# Patient Record
Sex: Female | Born: 1965 | Race: White | Hispanic: No | Marital: Single | Smoking: Never smoker
Health system: Southern US, Community
[De-identification: ages and names within clinical notes are randomized; demographics above are authoritative.]

## PROBLEM LIST (undated history)

## (undated) ENCOUNTER — Ambulatory Visit: Admission: EM | Discharge status: Absent without leave | Payer: 59

## (undated) DIAGNOSIS — R51 Headache: Secondary | ICD-10-CM

## (undated) DIAGNOSIS — C50919 Malignant neoplasm of unspecified site of unspecified female breast: Secondary | ICD-10-CM

## (undated) DIAGNOSIS — R519 Headache, unspecified: Secondary | ICD-10-CM

## (undated) DIAGNOSIS — F419 Anxiety disorder, unspecified: Secondary | ICD-10-CM

## (undated) DIAGNOSIS — Z923 Personal history of irradiation: Secondary | ICD-10-CM

## (undated) DIAGNOSIS — I10 Essential (primary) hypertension: Secondary | ICD-10-CM

## (undated) HISTORY — PX: TUBAL LIGATION: SHX77

## (undated) HISTORY — DX: Headache: R51

## (undated) HISTORY — DX: Malignant neoplasm of unspecified site of unspecified female breast: C50.919

## (undated) HISTORY — DX: Essential (primary) hypertension: I10

## (undated) HISTORY — PX: DIAGNOSTIC LAPAROSCOPY: SUR761

## (undated) HISTORY — DX: Headache, unspecified: R51.9

---

## 2008-05-16 ENCOUNTER — Inpatient Hospital Stay: Payer: Self-pay | Admitting: Obstetrics and Gynecology

## 2008-05-25 ENCOUNTER — Ambulatory Visit: Payer: Self-pay | Admitting: Obstetrics and Gynecology

## 2008-05-30 ENCOUNTER — Ambulatory Visit: Payer: Self-pay | Admitting: Obstetrics and Gynecology

## 2008-05-31 ENCOUNTER — Ambulatory Visit: Payer: Self-pay | Admitting: Oncology

## 2008-06-02 ENCOUNTER — Ambulatory Visit: Payer: Self-pay | Admitting: Oncology

## 2008-06-07 ENCOUNTER — Ambulatory Visit: Payer: Self-pay | Admitting: Obstetrics and Gynecology

## 2008-06-14 ENCOUNTER — Ambulatory Visit: Payer: Self-pay | Admitting: Obstetrics and Gynecology

## 2011-12-09 ENCOUNTER — Ambulatory Visit: Payer: Self-pay | Admitting: Internal Medicine

## 2012-02-22 ENCOUNTER — Observation Stay: Payer: Self-pay | Admitting: Internal Medicine

## 2012-02-22 ENCOUNTER — Ambulatory Visit: Payer: Self-pay | Admitting: Internal Medicine

## 2012-02-22 LAB — DRUG SCREEN, URINE
Barbiturates, Ur Screen: NEGATIVE (ref ?–200)
Benzodiazepine, Ur Scrn: NEGATIVE (ref ?–200)
Cannabinoid 50 Ng, Ur ~~LOC~~: NEGATIVE (ref ?–50)
Cocaine Metabolite,Ur ~~LOC~~: NEGATIVE (ref ?–300)
MDMA (Ecstasy)Ur Screen: NEGATIVE (ref ?–500)
Methadone, Ur Screen: NEGATIVE (ref ?–300)
Phencyclidine (PCP) Ur S: NEGATIVE (ref ?–25)

## 2012-02-22 LAB — COMPREHENSIVE METABOLIC PANEL
Albumin: 4.4 g/dL (ref 3.4–5.0)
Anion Gap: 7 (ref 7–16)
Calcium, Total: 9.2 mg/dL (ref 8.5–10.1)
Chloride: 102 mmol/L (ref 98–107)
EGFR (African American): >60
EGFR (Non-African Amer.): >60
Glucose: 91 mg/dL (ref 65–99)
Osmolality: 279 (ref 275–301)
Potassium: 3.7 mmol/L (ref 3.5–5.1)
Sodium: 140 mmol/L (ref 136–145)
Total Protein: 7.4 g/dL (ref 6.4–8.2)

## 2012-02-22 LAB — URINALYSIS, COMPLETE
Bilirubin,UR: NEGATIVE
Glucose,UR: NEGATIVE mg/dL (ref 0–75)
Ketone: NEGATIVE
Ph: 7 (ref 4.5–8.0)
Protein: NEGATIVE
Specific Gravity: 1.01 (ref 1.003–1.030)

## 2012-02-22 LAB — CK TOTAL AND CKMB (NOT AT ARMC): CK, Total: 44 U/L (ref 21–215)

## 2012-02-22 LAB — CBC WITH DIFFERENTIAL/PLATELET
Basophil #: 0.1 10*3/uL (ref 0.0–0.1)
Eosinophil #: 0.7 10*3/uL (ref 0.0–0.7)
Lymphocyte #: 2 10*3/uL (ref 1.0–3.6)
MCH: 31.2 pg (ref 26.0–34.0)
MCHC: 34.2 g/dL (ref 32.0–36.0)
MCV: 91 fL (ref 80–100)
Monocyte #: 0.4 x10 3/mm (ref 0.2–0.9)
Monocyte %: 6.2 %
Platelet: 260 10*3/uL (ref 150–440)
RDW: 12.1 % (ref 11.5–14.5)

## 2012-02-22 LAB — PREGNANCY, URINE: Pregnancy Test, Urine: NEGATIVE m[IU]/mL

## 2012-02-22 LAB — TROPONIN I: Troponin-I: <0.02 ng/mL

## 2012-02-23 LAB — TROPONIN I
Troponin-I: <0.02 ng/mL
Troponin-I: <0.02 ng/mL

## 2012-02-23 LAB — BASIC METABOLIC PANEL
BUN: 16 mg/dL (ref 7–18)
Calcium, Total: 8.5 mg/dL (ref 8.5–10.1)
Chloride: 106 mmol/L (ref 98–107)
EGFR (African American): >60
EGFR (Non-African Amer.): >60
Glucose: 110 mg/dL — ABNORMAL HIGH (ref 65–99)
Osmolality: 283 (ref 275–301)
Potassium: 3.9 mmol/L (ref 3.5–5.1)
Sodium: 141 mmol/L (ref 136–145)

## 2012-02-23 LAB — LIPID PANEL
Ldl Cholesterol, Calc: 84 mg/dL (ref 0–100)
Triglycerides: 296 mg/dL — ABNORMAL HIGH (ref 0–200)
VLDL Cholesterol, Calc: 59 mg/dL — ABNORMAL HIGH (ref 5–40)

## 2012-02-23 LAB — CK TOTAL AND CKMB (NOT AT ARMC)
CK, Total: 33 U/L (ref 21–215)
CK-MB: <0.5 ng/mL — ABNORMAL LOW (ref 0.5–3.6)

## 2012-05-03 ENCOUNTER — Ambulatory Visit: Payer: Self-pay | Admitting: Internal Medicine

## 2012-10-02 ENCOUNTER — Emergency Department: Payer: Self-pay | Admitting: Emergency Medicine

## 2012-10-02 LAB — URINALYSIS, COMPLETE
Bacteria: NONE SEEN
Bilirubin,UR: NEGATIVE
Glucose,UR: NEGATIVE mg/dL (ref 0–75)
Ketone: NEGATIVE
Nitrite: NEGATIVE
Ph: 5 (ref 4.5–8.0)
RBC,UR: 4 /HPF (ref 0–5)
Squamous Epithelial: 7
WBC UR: 9 /HPF (ref 0–5)

## 2012-10-02 LAB — COMPREHENSIVE METABOLIC PANEL
Alkaline Phosphatase: 104 U/L (ref 50–136)
Anion Gap: 8 (ref 7–16)
Bilirubin,Total: 0.3 mg/dL (ref 0.2–1.0)
Calcium, Total: 8.9 mg/dL (ref 8.5–10.1)
Chloride: 107 mmol/L (ref 98–107)
Creatinine: 0.7 mg/dL (ref 0.60–1.30)
EGFR (African American): >60
Glucose: 113 mg/dL — ABNORMAL HIGH (ref 65–99)
Osmolality: 280 (ref 275–301)
Potassium: 4 mmol/L (ref 3.5–5.1)
Sodium: 139 mmol/L (ref 136–145)
Total Protein: 7.9 g/dL (ref 6.4–8.2)

## 2012-10-02 LAB — CBC
HGB: 15.3 g/dL (ref 12.0–16.0)
MCH: 30.4 pg (ref 26.0–34.0)
MCHC: 33.8 g/dL (ref 32.0–36.0)
WBC: 18.7 10*3/uL — ABNORMAL HIGH (ref 3.6–11.0)

## 2012-10-02 LAB — LIPASE, BLOOD: Lipase: 91 U/L (ref 73–393)

## 2012-10-03 ENCOUNTER — Ambulatory Visit: Payer: Self-pay | Admitting: Family Medicine

## 2012-10-03 LAB — URINALYSIS, COMPLETE
Ketone: NEGATIVE
Leukocyte Esterase: NEGATIVE
Nitrite: NEGATIVE
Ph: 6.5 (ref 4.5–8.0)
WBC UR: NONE SEEN /HPF (ref 0–5)

## 2012-10-03 LAB — PREGNANCY, URINE: Pregnancy Test, Urine: NEGATIVE m[IU]/mL

## 2013-11-01 ENCOUNTER — Ambulatory Visit: Payer: Self-pay | Admitting: Internal Medicine

## 2013-11-12 ENCOUNTER — Ambulatory Visit: Payer: Self-pay | Admitting: Internal Medicine

## 2014-06-02 HISTORY — PX: BREAST BIOPSY: SHX20

## 2014-06-24 ENCOUNTER — Ambulatory Visit: Payer: Self-pay | Admitting: Internal Medicine

## 2014-06-25 ENCOUNTER — Ambulatory Visit: Payer: Self-pay | Admitting: Internal Medicine

## 2014-06-26 ENCOUNTER — Ambulatory Visit: Payer: Self-pay | Admitting: Internal Medicine

## 2014-07-02 HISTORY — PX: BREAST BIOPSY: SHX20

## 2014-07-03 ENCOUNTER — Other Ambulatory Visit: Payer: Self-pay | Admitting: Internal Medicine

## 2014-07-03 DIAGNOSIS — N6489 Other specified disorders of breast: Secondary | ICD-10-CM

## 2014-07-16 ENCOUNTER — Ambulatory Visit
Admission: RE | Admit: 2014-07-16 | Discharge: 2014-07-16 | Disposition: A | Discharge status: Home / Self Care | Payer: BC Managed Care – PPO | Source: Ambulatory Visit | Attending: Internal Medicine | Admitting: Internal Medicine

## 2014-07-16 DIAGNOSIS — N6489 Other specified disorders of breast: Secondary | ICD-10-CM

## 2014-07-16 MED ORDER — GADOBENATE DIMEGLUMINE 529 MG/ML IV SOLN
12.0000 mL | Freq: Once | INTRAVENOUS | Status: AC | PRN
  Administered 2014-07-16: 12 mL via INTRAVENOUS

## 2014-07-17 ENCOUNTER — Other Ambulatory Visit: Payer: Self-pay | Admitting: Internal Medicine

## 2014-07-17 DIAGNOSIS — R928 Other abnormal and inconclusive findings on diagnostic imaging of breast: Secondary | ICD-10-CM

## 2014-08-01 ENCOUNTER — Other Ambulatory Visit: Payer: Self-pay | Admitting: Internal Medicine

## 2014-08-01 ENCOUNTER — Ambulatory Visit
Admission: RE | Admit: 2014-08-01 | Discharge: 2014-08-01 | Disposition: A | Discharge status: Home / Self Care | Payer: BC Managed Care – PPO | Source: Ambulatory Visit | Attending: Internal Medicine | Admitting: Internal Medicine

## 2014-08-01 DIAGNOSIS — R928 Other abnormal and inconclusive findings on diagnostic imaging of breast: Secondary | ICD-10-CM

## 2014-08-01 MED ORDER — GADOBENATE DIMEGLUMINE 529 MG/ML IV SOLN
12.0000 mL | Freq: Once | INTRAVENOUS | Status: AC | PRN
  Administered 2014-08-01: 12 mL via INTRAVENOUS

## 2014-08-07 ENCOUNTER — Telehealth: Payer: Self-pay | Admitting: *Deleted

## 2014-08-07 DIAGNOSIS — C50412 Malignant neoplasm of upper-outer quadrant of left female breast: Secondary | ICD-10-CM

## 2014-08-07 NOTE — Telephone Encounter (Signed)
Confirmed [facility] for 08/14/14 at 8am .  Instructions and contact information given.

## 2014-08-14 ENCOUNTER — Encounter (INDEPENDENT_AMBULATORY_CARE_PROVIDER_SITE_OTHER): Payer: Self-pay

## 2014-08-14 ENCOUNTER — Ambulatory Visit (INDEPENDENT_AMBULATORY_CARE_PROVIDER_SITE_OTHER): Payer: Self-pay | Admitting: General Surgery

## 2014-08-14 ENCOUNTER — Ambulatory Visit: Payer: BLUE CROSS/BLUE SHIELD

## 2014-08-14 ENCOUNTER — Ambulatory Visit (HOSPITAL_BASED_OUTPATIENT_CLINIC_OR_DEPARTMENT_OTHER): Payer: BLUE CROSS/BLUE SHIELD | Admitting: Hematology and Oncology

## 2014-08-14 ENCOUNTER — Other Ambulatory Visit (INDEPENDENT_AMBULATORY_CARE_PROVIDER_SITE_OTHER): Payer: Self-pay | Admitting: General Surgery

## 2014-08-14 ENCOUNTER — Other Ambulatory Visit (HOSPITAL_BASED_OUTPATIENT_CLINIC_OR_DEPARTMENT_OTHER): Payer: BLUE CROSS/BLUE SHIELD

## 2014-08-14 ENCOUNTER — Ambulatory Visit
Admission: RE | Admit: 2014-08-14 | Discharge: 2014-08-14 | Disposition: A | Discharge status: Home / Self Care | Payer: BLUE CROSS/BLUE SHIELD | Source: Ambulatory Visit | Attending: Radiation Oncology | Admitting: Radiation Oncology

## 2014-08-14 ENCOUNTER — Encounter: Payer: Self-pay | Admitting: Hematology and Oncology

## 2014-08-14 ENCOUNTER — Encounter: Payer: Self-pay | Admitting: *Deleted

## 2014-08-14 VITALS — BP 132/77 | HR 61 | Temp 98.1°F | Resp 18 | Ht 64.0 in | Wt 138.7 lb

## 2014-08-14 DIAGNOSIS — C50412 Malignant neoplasm of upper-outer quadrant of left female breast: Secondary | ICD-10-CM

## 2014-08-14 DIAGNOSIS — C50912 Malignant neoplasm of unspecified site of left female breast: Secondary | ICD-10-CM

## 2014-08-14 DIAGNOSIS — Z171 Estrogen receptor negative status [ER-]: Secondary | ICD-10-CM

## 2014-08-14 LAB — CBC WITH DIFFERENTIAL/PLATELET
BASO%: 1.2 % (ref 0.0–2.0)
Basophils Absolute: 0.1 10*3/uL (ref 0.0–0.1)
EOS%: 8.7 % — ABNORMAL HIGH (ref 0.0–7.0)
Eosinophils Absolute: 0.7 10*3/uL — ABNORMAL HIGH (ref 0.0–0.5)
HCT: 41.8 % (ref 34.8–46.6)
HGB: 13.8 g/dL (ref 11.6–15.9)
LYMPH%: 27.3 % (ref 14.0–49.7)
MCH: 30.6 pg (ref 25.1–34.0)
MCHC: 32.9 g/dL (ref 31.5–36.0)
MCV: 93 fL (ref 79.5–101.0)
MONO#: 0.6 10*3/uL (ref 0.1–0.9)
MONO%: 8.1 % (ref 0.0–14.0)
NEUT#: 4.3 10*3/uL (ref 1.5–6.5)
NEUT%: 54.7 % (ref 38.4–76.8)
Platelets: 305 10*3/uL (ref 145–400)
RBC: 4.5 10*6/uL (ref 3.70–5.45)
RDW: 12.6 % (ref 11.2–14.5)
WBC: 7.8 10*3/uL (ref 3.9–10.3)
lymph#: 2.1 10*3/uL (ref 0.9–3.3)

## 2014-08-14 LAB — COMPREHENSIVE METABOLIC PANEL (CC13)
ALT: 10 U/L (ref 0–55)
AST: 14 U/L (ref 5–34)
Albumin: 4.1 g/dL (ref 3.5–5.0)
Alkaline Phosphatase: 95 U/L (ref 40–150)
Anion Gap: 8 meq/L (ref 3–11)
BUN: 12.9 mg/dL (ref 7.0–26.0)
CO2: 28 meq/L (ref 22–29)
Calcium: 8.7 mg/dL (ref 8.4–10.4)
Chloride: 106 meq/L (ref 98–109)
Creatinine: 0.7 mg/dL (ref 0.6–1.1)
EGFR: >90 mL/min/{1.73_m2}
Glucose: 89 mg/dL (ref 70–140)
Potassium: 3.8 meq/L (ref 3.5–5.1)
Sodium: 142 meq/L (ref 136–145)
Total Bilirubin: 0.46 mg/dL (ref 0.20–1.20)
Total Protein: 6.8 g/dL (ref 6.4–8.3)

## 2014-08-14 NOTE — Assessment & Plan Note (Signed)
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 I discussed that stage 0 breast cancer and described the size of his found on MRI. Also discussed the significance of being estrogen and progesterone receptor negative.  Recommendation: Based on multidisciplinary tumor board Breast conserving surgery followed by radiation Genetics counseling  Antiestrogen therapy counseling: I discussed with her briefly that antiestrogen therapy would not be of great benefit because she has ER/PR negative disease. However there are small patients who may benefit from antiestrogen therapy to prevent ER positive DCIS of breast cancer. Patient was not very keen on undergoing this therapy given the risks of antiestrogen treatment and very minimal benefit.  Return to clinic after surgery to discuss adjuvant treatment options.

## 2014-08-14 NOTE — Progress Notes (Signed)
[place] Psychosocial Distress Screening Clinical Social Work [facility]  Patient completed distress screening protocol and scored a 4 on the Psychosocial Distress Thermometer which indicates mild distress. Clinical Social Worker met with patient and patients daughter in [facility] to assess for distress and other psychosocial needs.  Patient stated she was "doign ok" after meeting with the treatment team and getting information on her treatment plan.  CSW and patient discussed common emotional responses to being diagnosed with cancer.  CSw informed patient of the support team and support services at [facility].  CSW provided contact information and encouraged her to call with any questions or concerns.      ONCBCN DISTRESS SCREENING 08/14/2014  Screening Type Initial Screening  Distress experienced in past week (1-10) 4  Practical problem type Insurance;Work/school  Emotional problem type Nervousness/Anxiety;Adjusting to illness;Adjusting to appearance changes  Spiritual/Religous concerns type Relating to God  Physician notified of physical symptoms Yes  Referral to clinical social work Yes  Referral to support programs Yes   Johnnye Lana, MSW, LCSW, OSW-C Clinical Social Worker [place] [phone]

## 2014-08-14 NOTE — Progress Notes (Signed)
Ms. Hillegass is a very pleasant 49 y.o. 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  Biopsy results revealed the tumor's hormone status as ER negative and PR negative.   She presents today with her daughter to the [facility]) for treatment consideration and recommendations from the breast surgeon, radiation oncologist, and medical oncologist.     I briefly met with Ms. Garno and her daughter during her [facility] visit today. We discussed the purpose of the Survivorship Clinic, which will include monitoring for recurrence, coordinating completion of age and gender-appropriate cancer screenings, promotion of overall wellness, as well as managing potential late/long-term side effects of anti-cancer treatments.    As of today, the treatment plan for Ms. Bayles will likely include surgery and radiation therapy.  She will meet with the Genetics Counselor due to her current age at diagnosis.  The intent of treatment for Ms. Portee is cure, therefore she will be eligible for the Survivorship Clinic upon her completion of treatment.  Her survivorship care plan (SCP) document will be drafted and updated throughout the course of her treatment trajectory. She will receive the SCP in an office visit with myself in the Survivorship Clinic once she has completed treatment.   Ms. Lal was encouraged to ask questions and all questions were answered to her satisfaction.  She was given my business card and encouraged to contact me with any concerns regarding survivorship.  I look forward to participating in her care.   Mike Craze, NP [place] [phone]

## 2014-08-14 NOTE — Progress Notes (Signed)
MD note created during office visit given to patient.  Original sent to scan.

## 2014-08-14 NOTE — Progress Notes (Signed)
Checked in new pt with no financial concerns prior to seeing the dr.  I informed pt if chemo is part of her treatment Raquel will call her ins to see if Josem Kaufmann is req and will obtain that if it is and will contact different foundations that offer copay assistance for chemo if needed.  She has Raquel's card for any billing or insurance questions or concerns.

## 2014-08-14 NOTE — Progress Notes (Signed)
[place] CONSULT NOTE  Patient Care Team: Harriett Neita Carp, MD as PCP - General (Internal Medicine) Excell Seltzer, MD as Consulting Physician (General Surgery) Rulon Eisenmenger, MD as Consulting Physician (Hematology and Oncology) Jodelle Gross, MD as Consulting Physician (Radiation Oncology) Trinda Pascal, NP as Nurse Practitioner (Nurse Practitioner) Roselee Culver, RN as Registered Nurse [facility], RN as Registered Nurse  CHIEF COMPLAINTS/PURPOSE OF CONSULTATION:  Newly diagnosed left breast DCIS  HISTORY OF PRESENTING ILLNESS:  Brittney Horton 49 y.o. female is here because of recent diagnosis of left breast DCIS She had a original mammogram 6 months prior which showed an abnormality this was followed up by another mammogram in 6 months which revealed left breast distortion in the left lower outer quadrant. On ultrasound there was no abnormality. She underwent breast MRI in 07/16/2014 that revealed a 1.9 x 1.6 x 1.2 cm area of enhancement. This was biopsied on 08/01/2014 it came back as DCIS grade 3 ER/PR negative. She was present this morning at the multidisciplinary tumor board and she is here today at the [facility] clinic to discuss treatment options. She denies any pain discomfort or any issues related to her diagnosis. She is accompanied by her daughter today to discuss treatment options  I reviewed her records extensively and collaborated the history with the patient.  SUMMARY OF ONCOLOGIC HISTORY:   Breast cancer of upper-outer quadrant of left female breast   07/16/2014 Breast MRI Left breast: 1.9 x 1.6 x 1.2 cm irregular area of low-grade non-mass enhancement, multiple small bilateral cysts   08/01/2014 Initial Diagnosis Left breast lower outer quadrant biopsy: DCIS with necrosis and calcifications grade 3 ER 0% PR 0%    In terms of breast cancer risk profile:  She menarched at early age of 80 and still has periods  She had 1 pregnancy,  her first child was born at age 41  She hasnot received birth control pills She was never exposed to fertility medications or hormone replacement therapy.  She has no family history of Breast/GYN/GI cancer  MEDICAL HISTORY:  Past Medical History  Diagnosis Date  . Breast cancer   . Hypertension   . Headache     SURGICAL HISTORY: History reviewed. No pertinent past surgical history.  SOCIAL HISTORY:  History   Social History  . Marital Status: Single    Spouse Name: N/A    Number of Children: N/A  . Years of Education: N/A   Occupational History  . Not on file.   Social History Main Topics  . Smoking status: Never Smoker   . Smokeless tobacco: Not on file  . Alcohol Use: Yes  . Drug Use: No  . Sexual Activity: Not on file   Other Topics Concern  . Not on file   Social History Narrative  . No narrative on file    FAMILY HISTORY: History reviewed. No pertinent family history.  ALLERGIES:  has No Known Allergies.  MEDICATIONS:  Current Outpatient Prescriptions  Medication Sig Dispense Refill  . amLODipine (NORVASC) 5 MG tablet   2  . metoprolol succinate (TOPROL-XL) 50 MG 24 hr tablet   2  . traMADol (ULTRAM) 50 MG tablet Take by mouth.     No current facility-administered medications for this visit.    REVIEW OF SYSTEMS:   Constitutional: Denies fevers, chills or abnormal night sweats Eyes: Denies blurriness of vision, double vision or watery eyes Ears, nose, mouth, throat, and face: Denies mucositis or  sore throat Respiratory: Denies cough, dyspnea or wheezes Cardiovascular: Denies palpitation, chest discomfort or lower extremity swelling Gastrointestinal:  Denies nausea, heartburn or change in bowel habits Skin: Denies abnormal skin rashes Lymphatics: Denies new lymphadenopathy or easy bruising Neurological:Denies numbness, tingling or new weaknesses Behavioral/Psych: Mood is stable, no new changes  Breast:  Denies any palpable lumps or  discharge All other systems were reviewed with the patient and are negative.  PHYSICAL EXAMINATION: ECOG PERFORMANCE STATUS: 0 - Asymptomatic  Filed Vitals:   08/14/14 0917  BP: 132/77  Pulse: 61  Temp: 98.1 F (36.7 C)  Resp: 18   Filed Weights   08/14/14 0917  Weight: 138 lb 11.2 oz (62.914 kg)    GENERAL:alert, no distress and comfortable SKIN: skin color, texture, turgor are normal, no rashes or significant lesions EYES: normal, conjunctiva are pink and non-injected, sclera clear OROPHARYNX:no exudate, no erythema and lips, buccal mucosa, and tongue normal  NECK: supple, thyroid normal size, non-tender, without nodularity LYMPH:  no palpable lymphadenopathy in the cervical, axillary or inguinal LUNGS: clear to auscultation and percussion with normal breathing effort HEART: regular rate & rhythm and no murmurs and no lower extremity edema ABDOMEN:abdomen soft, non-tender and normal bowel sounds Musculoskeletal:no cyanosis of digits and no clubbing  PSYCH: alert & oriented x 3 with fluent speech NEURO: no focal motor/sensory deficits BREAST: No palpable nodules in breast. No palpable axillary or supraclavicular lymphadenopathy  LABORATORY DATA:  I have reviewed the data as listed Lab Results  Component Value Date   WBC 7.8 08/14/2014   HGB 13.8 08/14/2014   HCT 41.8 08/14/2014   MCV 93.0 08/14/2014   PLT 305 08/14/2014   Lab Results  Component Value Date   NA 142 08/14/2014   K 3.8 08/14/2014   CO2 28 08/14/2014    RADIOGRAPHIC STUDIES: I have personally reviewed the radiological reports and agreed with the findings in the report. 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 I discussed that stage 0 breast cancer and described the size of his found on MRI. Also discussed the significance of being estrogen and progesterone receptor negative.  Recommendation: Based on multidisciplinary tumor board Breast conserving surgery followed by radiation Genetics counseling  Antiestrogen therapy counseling: I discussed with her briefly that antiestrogen therapy would not be of great benefit because she has ER/PR negative disease. However there are small patients who may benefit from antiestrogen therapy to prevent ER positive DCIS of breast cancer. Patient was not very keen on undergoing this therapy given the risks of antiestrogen treatment and very minimal benefit.  Return to clinic after surgery to discuss adjuvant treatment options.   Rulon Eisenmenger, MD 11:37 AM

## 2014-08-15 ENCOUNTER — Other Ambulatory Visit: Payer: BLUE CROSS/BLUE SHIELD

## 2014-08-19 ENCOUNTER — Telehealth: Payer: Self-pay | Admitting: *Deleted

## 2014-08-19 NOTE — Telephone Encounter (Signed)
Spoke to pt concerning [place] from 08/14/14. Pt denies questions or concerns regarding dx or treatment care plan. Confirmed genetics appt and plan to wait for results to decide on surgery. Encourage pt to call with needs. Received verbal understanding. Contact information given.

## 2014-08-19 NOTE — Telephone Encounter (Signed)
Left vm for pt to return call regarding [place] from 08/14/14. Contact information given.

## 2014-08-20 NOTE — Progress Notes (Signed)
Radiation Oncology         [phone] ________________________________  Name: Brittney Horton MRN: [id number]  Date: 08/14/2014  DOB: 1965/09/06  PJ:KDTOI, Ala Dach, MD  Excell Seltzer, MD     REFERRING PHYSICIAN: Excell Seltzer, MD   DIAGNOSIS: The encounter diagnosis was Breast cancer of upper-outer quadrant of left female breast.  Breast cancer of upper-outer quadrant of left female breast   Staging form: Breast, AJCC 7th Edition     Clinical: Stage 0 (Tis (DCIS), N0, M0) - Unsigned       Staging comments: Staged at breast conference on 1.13.16     HISTORY OF PRESENT ILLNESS::Brittney Horton is a 49 y.o. female who is seen for an initial consultation visit regarding the patient's diagnosis of DCIS of the left breast.  The patient presented was originally with a suspicious abnormality/area of distortion on screening mammogram. An ultrasound of the left breast was negative. The area of distortion on mammogram was within the upper outer quadrant.   The patient has undergone an MRI scan of the breasts. This demonstrated a 1.9 cm corresponding lesion within the upper outer quadrant. An MRI guided biopsy was completed which revealed a grade 3 ductal carcinoma in situ. Receptor studies have indicated that the tumor is estrogen receptor negative and progesterone receptor negative. The lesion on MRI scan measured 1.9 cm in maximum dimension.    PREVIOUS RADIATION THERAPY: No   PAST MEDICAL HISTORY:  has a past medical history of Breast cancer; Hypertension; and Headache.     PAST SURGICAL HISTORY:No past surgical history on file.   FAMILY HISTORY: family history is not on file.   SOCIAL HISTORY:  reports that she has never smoked. She does not have any smokeless tobacco history on file. She reports that she drinks alcohol. She reports that she does not use illicit drugs.   ALLERGIES: Review of patient's allergies indicates no known allergies.   MEDICATIONS:  Current  Outpatient Prescriptions  Medication Sig Dispense Refill  . amLODipine (NORVASC) 5 MG tablet   2  . metoprolol succinate (TOPROL-XL) 50 MG 24 hr tablet   2  . traMADol (ULTRAM) 50 MG tablet Take by mouth.     No current facility-administered medications for this encounter.     REVIEW OF SYSTEMS:  A 15 point review of systems is documented in the electronic medical record. This was obtained by the nursing staff. However, I reviewed this with the patient to discuss relevant findings and make appropriate changes.  Pertinent items are noted in HPI.    PHYSICAL EXAM:  vitals were not taken for this visit.  ECOG = 1  0 - Asymptomatic (Fully active, able to carry on all predisease activities without restriction)  1 - Symptomatic but completely ambulatory (Restricted in physically strenuous activity but ambulatory and able to carry out work of a light or sedentary nature. For example, light housework, office work)  2 - Symptomatic, <50% in bed during the day (Ambulatory and capable of all self care but unable to carry out any work activities. Up and about more than 50% of waking hours)  3 - Symptomatic, >50% in bed, but not bedbound (Capable of only limited self-care, confined to bed or chair 50% or more of waking hours)  4 - Bedbound (Completely disabled. Cannot carry on any self-care. Totally confined to bed or chair)  5 - Death   Eustace Pen MM, Creech RH, Tormey DC, et al. [phone]). "Toxicity and response criteria of the  [place] Cooperative Oncology Group". [place] Oncol. 5 (6): 649-55  General: Well-developed, in no acute distress HEENT: Normocephalic, atraumatic; oral cavity clear Neck: Supple without any lymphadenopathy Cardiovascular: Regular rate and rhythm Respiratory: Clear to auscultation bilaterally Breasts: Examination of the left breast did not reveal any palpable nodule. No axillary adenopathy on the left. GI: Soft, nontender, normal bowel sounds Extremities: No edema  present Neuro: No focal deficits     LABORATORY DATA:  Lab Results  Component Value Date   WBC 7.8 08/14/2014   HGB 13.8 08/14/2014   HCT 41.8 08/14/2014   MCV 93.0 08/14/2014   PLT 305 08/14/2014   Lab Results  Component Value Date   NA 142 08/14/2014   K 3.8 08/14/2014   CO2 28 08/14/2014   Lab Results  Component Value Date   ALT 10 08/14/2014   AST 14 08/14/2014   ALKPHOS 95 08/14/2014   BILITOT 0.46 08/14/2014      RADIOGRAPHY: Mm Diag Breast Tomo Uni Left  08/01/2014   CLINICAL DATA:  Status post MR guided core biopsy of the left breast.  EXAM: DIGITAL DIAGNOSTIC UNILATERAL LEFT MAMMOGRAM WITH 3D TOMOSYNTHESIS POST MRI BIOPSY  COMPARISON:  Previous exams  FINDINGS: Mammographic images were obtained following MRI guided biopsy of in enhancement and distortion in the upper-outer quadrant of the left breast. An hourglass shaped clip is identified in the upper-outer quadrant of the left breast within the area of distortion in the upper-outer quadrant of the left breast. The clip is approximately 1.3 cm anterior, inferior, and medial to the center of the distortion.  IMPRESSION: Tissue marker clip is in the general region of the distortion in the upper-outer quadrant of the left breast. See above.  Final Assessment: Post Procedure Mammograms for Marker Placement   Electronically Signed   By: Shon Hale M.D.   On: 08/01/2014 11:38   Mr Aundra Millet Breast Bx Johnella Moloney Dev 1st Lesion Image Bx Spec Mr Guide  08/06/2014   ADDENDUM REPORT: 08/06/2014 13:23  ADDENDUM: The pathology associated with the left breast MR guided biopsy demonstrated DCIS. This is concordant with the imaging findings. I have discussed findings with the patient by telephone and answered her questions. Patient states her biopsy site is clean and dry without hematoma formation or signs of infection. Post biopsy wound care instructions were reviewed with the patient following discussion with the patient, the patient requests  treatment in [place]. The patient will be scheduled for the breast cancer multi disciplinary clinic at the [facility] on 08/15/2014. Patient was encouraged to call our breast center for additional questions or concerns.   Electronically Signed   By: Luberta Robertson M.D.   On: 08/06/2014 13:23   08/06/2014   CLINICAL DATA:  The patient presents for MR guided core biopsy of an area of enhancement/ distortion in the lateral portion of the left breast.  EXAM: MRI GUIDED CORE NEEDLE BIOPSY OF THE LEFT BREAST  TECHNIQUE: Multiplanar, multisequence MR imaging of the left breast was performed both before and after administration of intravenous contrast.  CONTRAST:  34m MULTIHANCE GADOBENATE DIMEGLUMINE 529 MG/ML IV SOLN  [facility]  [facility]  FINDINGS: I met with the patient, and we discussed the procedure of MRI guided biopsy, including risks, benefits, and alternatives. Specifically, we discussed the risks of infection, bleeding, tissue injury, clip migration, and inadequate sampling. Informed, written consent was given. The usual time out protocol was performed immediately  prior to the procedure.  Using sterile technique, 2% Lidocaine, MRI guidance, and a 9 gauge vacuum assisted device, biopsy was performed of area of enhancement/ distortion upper outer quadrant of the left breast using a lateral approach. At the conclusion of the procedure, a hourglass shaped tissue marker clip was deployed into the biopsy cavity. Follow-up 2-view mammogram was performed and dictated separately.  To clarify, the pathology request states lower outer quadrant based on the prone position of the patient in dependent position of the breast. However on clip films the area of distortion and biopsy are demonstrated to be in the upper-outer quadrant of the left breast.  IMPRESSION: MRI guided biopsy of distortion/enhancement in the left breast. No apparent  complications.  Electronically Signed: By: Shon Hale M.D. On: 08/01/2014 11:35       IMPRESSION:    Breast cancer of upper-outer quadrant of left female breast   07/16/2014 Breast MRI Left breast: 1.9 x 1.6 x 1.2 cm irregular area of low-grade non-mass enhancement, multiple small bilateral cysts   08/01/2014 Initial Diagnosis Left breast lower outer quadrant biopsy: DCIS with necrosis and calcifications grade 3 ER 0% PR 0%    The patient has a recent diagnosis of DCIS of the left breast. She appears to be a good candidate for breast conservation treatment.  I discussed with the patient the role of adjuvant radiation treatment in this setting. We discussed the potential benefit of radiation treatment, especially with regards to local control of the patient's tumor. We also discussed the possible side effects and risks of such a treatment as well.  All of the patient's questions were answered. The patient wishes to proceed with radiation treatment at the appropriate time.  PLAN: I look forward to seeing the patient postoperatively to review her case and further discuss and coordinate an anticipated course of radiation treatment.    I spent 60 minutes face to face with the patient and more than 50% of that time was spent in counseling and/or coordination of care.    ________________________________   Jodelle Gross, MD, PhD   **Disclaimer: This note was dictated with voice recognition software. Similar sounding words can inadvertently be transcribed and this note may contain transcription errors which may not have been corrected upon publication of note.**

## 2014-08-22 ENCOUNTER — Telehealth: Payer: Self-pay | Admitting: *Deleted

## 2014-08-22 NOTE — Telephone Encounter (Signed)
Pt called to inform she had decided she would like to proceed with surgery without having her genetic testing performed. I have informed the physician team and Debbie, Dr. Lear Ng surgery scheduler.

## 2014-08-26 ENCOUNTER — Encounter: Payer: BLUE CROSS/BLUE SHIELD | Admitting: Genetic Counselor

## 2014-08-26 ENCOUNTER — Other Ambulatory Visit: Payer: Self-pay | Admitting: General Surgery

## 2014-08-26 ENCOUNTER — Other Ambulatory Visit (INDEPENDENT_AMBULATORY_CARE_PROVIDER_SITE_OTHER): Payer: Self-pay | Admitting: General Surgery

## 2014-08-26 ENCOUNTER — Other Ambulatory Visit: Payer: BLUE CROSS/BLUE SHIELD

## 2014-08-26 ENCOUNTER — Telehealth: Payer: Self-pay | Admitting: *Deleted

## 2014-08-26 DIAGNOSIS — C50912 Malignant neoplasm of unspecified site of left female breast: Secondary | ICD-10-CM

## 2014-08-26 NOTE — Telephone Encounter (Signed)
Pt called to r/s genetics appt d/t weather condition and roads. R/s and confirm new appt date for 09/05/14 at 2:30PM. Pt denies further needs at this time.

## 2014-08-27 ENCOUNTER — Encounter: Payer: Self-pay | Admitting: *Deleted

## 2014-08-28 ENCOUNTER — Encounter: Payer: Self-pay | Admitting: Hematology and Oncology

## 2014-08-28 ENCOUNTER — Telehealth: Payer: Self-pay | Admitting: Hematology and Oncology

## 2014-08-28 NOTE — Progress Notes (Signed)
Received msg from [employer] that pt wanted to apply for assistance so I called and left vm for pt to return my call to discuss her options.

## 2014-08-29 ENCOUNTER — Other Ambulatory Visit (INDEPENDENT_AMBULATORY_CARE_PROVIDER_SITE_OTHER): Payer: Self-pay | Admitting: General Surgery

## 2014-08-29 DIAGNOSIS — D0512 Intraductal carcinoma in situ of left breast: Secondary | ICD-10-CM

## 2014-09-02 ENCOUNTER — Encounter: Payer: Self-pay | Admitting: Hematology and Oncology

## 2014-09-02 NOTE — Progress Notes (Signed)
Called patient from a message she left me. She sd she had already spoke with someone.

## 2014-09-03 ENCOUNTER — Encounter (HOSPITAL_BASED_OUTPATIENT_CLINIC_OR_DEPARTMENT_OTHER): Payer: Self-pay | Admitting: *Deleted

## 2014-09-03 NOTE — Progress Notes (Signed)
To come in for ekg-

## 2014-09-05 ENCOUNTER — Encounter (HOSPITAL_BASED_OUTPATIENT_CLINIC_OR_DEPARTMENT_OTHER)
Admission: RE | Admit: 2014-09-05 | Discharge: 2014-09-05 | Disposition: A | Discharge status: Home / Self Care | Payer: BLUE CROSS/BLUE SHIELD | Source: Ambulatory Visit | Attending: General Surgery | Admitting: General Surgery

## 2014-09-05 ENCOUNTER — Ambulatory Visit
Admission: RE | Admit: 2014-09-05 | Discharge: 2014-09-05 | Disposition: A | Discharge status: Home / Self Care | Payer: BLUE CROSS/BLUE SHIELD | Source: Ambulatory Visit | Attending: General Surgery | Admitting: General Surgery

## 2014-09-05 ENCOUNTER — Ambulatory Visit (HOSPITAL_BASED_OUTPATIENT_CLINIC_OR_DEPARTMENT_OTHER): Payer: BLUE CROSS/BLUE SHIELD | Admitting: Genetic Counselor

## 2014-09-05 ENCOUNTER — Other Ambulatory Visit: Payer: BLUE CROSS/BLUE SHIELD

## 2014-09-05 DIAGNOSIS — D0512 Intraductal carcinoma in situ of left breast: Secondary | ICD-10-CM

## 2014-09-05 DIAGNOSIS — C50912 Malignant neoplasm of unspecified site of left female breast: Secondary | ICD-10-CM

## 2014-09-05 DIAGNOSIS — G43909 Migraine, unspecified, not intractable, without status migrainosus: Secondary | ICD-10-CM | POA: Diagnosis not present

## 2014-09-05 DIAGNOSIS — I10 Essential (primary) hypertension: Secondary | ICD-10-CM | POA: Diagnosis not present

## 2014-09-05 DIAGNOSIS — Z171 Estrogen receptor negative status [ER-]: Secondary | ICD-10-CM | POA: Diagnosis not present

## 2014-09-05 DIAGNOSIS — D0592 Unspecified type of carcinoma in situ of left breast: Secondary | ICD-10-CM

## 2014-09-05 DIAGNOSIS — Z853 Personal history of malignant neoplasm of breast: Secondary | ICD-10-CM

## 2014-09-05 DIAGNOSIS — Z315 Encounter for genetic counseling: Secondary | ICD-10-CM

## 2014-09-05 DIAGNOSIS — Z803 Family history of malignant neoplasm of breast: Secondary | ICD-10-CM

## 2014-09-05 DIAGNOSIS — F419 Anxiety disorder, unspecified: Secondary | ICD-10-CM | POA: Diagnosis not present

## 2014-09-06 ENCOUNTER — Encounter (HOSPITAL_BASED_OUTPATIENT_CLINIC_OR_DEPARTMENT_OTHER): Payer: Self-pay | Admitting: *Deleted

## 2014-09-06 ENCOUNTER — Encounter (HOSPITAL_BASED_OUTPATIENT_CLINIC_OR_DEPARTMENT_OTHER): Payer: Self-pay | Admitting: Anesthesiology

## 2014-09-06 ENCOUNTER — Encounter (HOSPITAL_COMMUNITY)
Admission: RE | Admit: 2014-09-06 | Discharge: 2014-09-06 | Disposition: A | Discharge status: Home / Self Care | Payer: BLUE CROSS/BLUE SHIELD | Source: Ambulatory Visit | Attending: General Surgery | Admitting: General Surgery

## 2014-09-06 ENCOUNTER — Ambulatory Visit (HOSPITAL_BASED_OUTPATIENT_CLINIC_OR_DEPARTMENT_OTHER): Payer: BLUE CROSS/BLUE SHIELD | Admitting: Anesthesiology

## 2014-09-06 ENCOUNTER — Ambulatory Visit
Admission: RE | Admit: 2014-09-06 | Discharge: 2014-09-06 | Disposition: A | Discharge status: Home / Self Care | Payer: BLUE CROSS/BLUE SHIELD | Source: Ambulatory Visit | Attending: General Surgery | Admitting: General Surgery

## 2014-09-06 ENCOUNTER — Ambulatory Visit (HOSPITAL_BASED_OUTPATIENT_CLINIC_OR_DEPARTMENT_OTHER)
Admission: RE | Admit: 2014-09-06 | Discharge: 2014-09-06 | Disposition: A | Discharge status: Home / Self Care | Payer: BLUE CROSS/BLUE SHIELD | Source: Ambulatory Visit | Attending: General Surgery | Admitting: General Surgery

## 2014-09-06 ENCOUNTER — Encounter (HOSPITAL_BASED_OUTPATIENT_CLINIC_OR_DEPARTMENT_OTHER)
Admission: RE | Disposition: A | Discharge status: Home / Self Care | Payer: Self-pay | Source: Ambulatory Visit | Attending: General Surgery

## 2014-09-06 ENCOUNTER — Encounter: Payer: Self-pay | Admitting: Genetic Counselor

## 2014-09-06 DIAGNOSIS — C50912 Malignant neoplasm of unspecified site of left female breast: Secondary | ICD-10-CM

## 2014-09-06 DIAGNOSIS — F419 Anxiety disorder, unspecified: Secondary | ICD-10-CM | POA: Insufficient documentation

## 2014-09-06 DIAGNOSIS — D0512 Intraductal carcinoma in situ of left breast: Secondary | ICD-10-CM | POA: Diagnosis not present

## 2014-09-06 DIAGNOSIS — Z171 Estrogen receptor negative status [ER-]: Secondary | ICD-10-CM | POA: Insufficient documentation

## 2014-09-06 DIAGNOSIS — Z803 Family history of malignant neoplasm of breast: Secondary | ICD-10-CM | POA: Insufficient documentation

## 2014-09-06 DIAGNOSIS — C50412 Malignant neoplasm of upper-outer quadrant of left female breast: Secondary | ICD-10-CM

## 2014-09-06 DIAGNOSIS — D0590 Unspecified type of carcinoma in situ of unspecified breast: Secondary | ICD-10-CM | POA: Insufficient documentation

## 2014-09-06 DIAGNOSIS — G43909 Migraine, unspecified, not intractable, without status migrainosus: Secondary | ICD-10-CM | POA: Insufficient documentation

## 2014-09-06 DIAGNOSIS — I10 Essential (primary) hypertension: Secondary | ICD-10-CM | POA: Insufficient documentation

## 2014-09-06 HISTORY — PX: BREAST LUMPECTOMY: SHX2

## 2014-09-06 HISTORY — PX: BREAST LUMPECTOMY WITH AXILLARY LYMPH NODE BIOPSY: SHX5593

## 2014-09-06 SURGERY — BREAST LUMPECTOMY WITH RADIOACTIVE SEED AND SENTINEL LYMPH NODE BIOPSY
Anesthesia: General | Site: Breast | Laterality: Left

## 2014-09-06 MED ORDER — FENTANYL CITRATE 0.05 MG/ML IJ SOLN
50.0000 ug | INTRAMUSCULAR | Status: DC | PRN
  Administered 2014-09-06: 100 ug via INTRAVENOUS

## 2014-09-06 MED ORDER — MIDAZOLAM HCL 5 MG/5ML IJ SOLN
INTRAMUSCULAR | Status: DC | PRN
  Administered 2014-09-06: 2 mg via INTRAVENOUS

## 2014-09-06 MED ORDER — TECHNETIUM TC 99M SULFUR COLLOID FILTERED: 1.0000 | Freq: Once | INTRAVENOUS | Status: AC | PRN

## 2014-09-06 MED ORDER — HYDROMORPHONE HCL 1 MG/ML IJ SOLN
0.2500 mg | INTRAMUSCULAR | Status: DC | PRN
  Administered 2014-09-06 (×2): 0.5 mg via INTRAVENOUS

## 2014-09-06 MED ORDER — OXYCODONE HCL 5 MG/5ML PO SOLN: 5.0000 mg | Freq: Once | ORAL | Status: AC | PRN

## 2014-09-06 MED ORDER — CHLORHEXIDINE GLUCONATE 4 % EX LIQD: 1.0000 "application " | Freq: Once | CUTANEOUS | Status: DC

## 2014-09-06 MED ORDER — ONDANSETRON HCL 4 MG/2ML IJ SOLN
INTRAMUSCULAR | Status: DC | PRN
  Administered 2014-09-06: 4 mg via INTRAVENOUS

## 2014-09-06 MED ORDER — LACTATED RINGERS IV SOLN
INTRAVENOUS | Status: DC
  Administered 2014-09-06 (×2): via INTRAVENOUS

## 2014-09-06 MED ORDER — HYDROMORPHONE HCL 1 MG/ML IJ SOLN
INTRAMUSCULAR | Status: AC
  Filled 2014-09-06: qty 1

## 2014-09-06 MED ORDER — MIDAZOLAM HCL 2 MG/2ML IJ SOLN
INTRAMUSCULAR | Status: AC
  Filled 2014-09-06: qty 2

## 2014-09-06 MED ORDER — FENTANYL CITRATE 0.05 MG/ML IJ SOLN
INTRAMUSCULAR | Status: AC
  Filled 2014-09-06: qty 4

## 2014-09-06 MED ORDER — OXYCODONE HCL 5 MG PO TABS
5.0000 mg | ORAL_TABLET | Freq: Once | ORAL | Status: AC | PRN
  Administered 2014-09-06: 5 mg via ORAL

## 2014-09-06 MED ORDER — PROPOFOL 10 MG/ML IV BOLUS
INTRAVENOUS | Status: AC
  Filled 2014-09-06: qty 20

## 2014-09-06 MED ORDER — HYDROCODONE-ACETAMINOPHEN 5-325 MG PO TABS: 1.0000 | ORAL_TABLET | ORAL | Status: DC | PRN

## 2014-09-06 MED ORDER — FENTANYL CITRATE 0.05 MG/ML IJ SOLN
INTRAMUSCULAR | Status: AC
  Filled 2014-09-06: qty 2

## 2014-09-06 MED ORDER — CHLORHEXIDINE GLUCONATE 4 % EX LIQD
1.0000 | Freq: Once | CUTANEOUS | Status: DC
Start: 2014-09-07 — End: 2014-09-06

## 2014-09-06 MED ORDER — OXYCODONE HCL 5 MG PO TABS
ORAL_TABLET | ORAL | Status: AC
  Filled 2014-09-06: qty 1

## 2014-09-06 MED ORDER — BUPIVACAINE-EPINEPHRINE (PF) 0.25% -1:200000 IJ SOLN
INTRAMUSCULAR | Status: DC | PRN
  Administered 2014-09-06: 9 mL via PERINEURAL

## 2014-09-06 MED ORDER — FENTANYL CITRATE 0.05 MG/ML IJ SOLN
INTRAMUSCULAR | Status: DC | PRN
  Administered 2014-09-06: 100 ug via INTRAVENOUS

## 2014-09-06 MED ORDER — CEFAZOLIN SODIUM-DEXTROSE 2-3 GM-% IV SOLR
INTRAVENOUS | Status: AC
  Filled 2014-09-06: qty 50

## 2014-09-06 MED ORDER — DEXAMETHASONE SODIUM PHOSPHATE 4 MG/ML IJ SOLN
INTRAMUSCULAR | Status: DC | PRN
  Administered 2014-09-06: 10 mg via INTRAVENOUS

## 2014-09-06 MED ORDER — SODIUM CHLORIDE 0.9 % IJ SOLN
INTRAMUSCULAR | Status: AC
  Filled 2014-09-06: qty 10

## 2014-09-06 MED ORDER — SCOPOLAMINE 1 MG/3DAYS TD PT72
1.0000 | MEDICATED_PATCH | TRANSDERMAL | Status: DC
  Administered 2014-09-06: 1.5 mg via TRANSDERMAL

## 2014-09-06 MED ORDER — METHYLENE BLUE 1 % INJ SOLN
INTRAMUSCULAR | Status: DC | PRN
  Administered 2014-09-06: 5 mL

## 2014-09-06 MED ORDER — PROPOFOL 10 MG/ML IV BOLUS
INTRAVENOUS | Status: DC | PRN
  Administered 2014-09-06: 200 mg via INTRAVENOUS

## 2014-09-06 MED ORDER — ONDANSETRON HCL 4 MG/2ML IJ SOLN: 4.0000 mg | Freq: Once | INTRAMUSCULAR | Status: DC | PRN

## 2014-09-06 MED ORDER — BUPIVACAINE-EPINEPHRINE (PF) 0.25% -1:200000 IJ SOLN
INTRAMUSCULAR | Status: AC
  Filled 2014-09-06: qty 30

## 2014-09-06 MED ORDER — LIDOCAINE HCL (CARDIAC) 20 MG/ML IV SOLN
INTRAVENOUS | Status: DC | PRN
  Administered 2014-09-06: 70 mg via INTRAVENOUS

## 2014-09-06 MED ORDER — SCOPOLAMINE 1 MG/3DAYS TD PT72
MEDICATED_PATCH | TRANSDERMAL | Status: AC
  Filled 2014-09-06: qty 1

## 2014-09-06 MED ORDER — MIDAZOLAM HCL 2 MG/2ML IJ SOLN
1.0000 mg | INTRAMUSCULAR | Status: DC | PRN
  Administered 2014-09-06: 2 mg via INTRAVENOUS

## 2014-09-06 MED ORDER — CEFAZOLIN SODIUM-DEXTROSE 2-3 GM-% IV SOLR
2.0000 g | INTRAVENOUS | Status: AC
  Administered 2014-09-06: 2 g via INTRAVENOUS

## 2014-09-06 MED ORDER — METHYLENE BLUE 1 % INJ SOLN
INTRAMUSCULAR | Status: AC
  Filled 2014-09-06: qty 10

## 2014-09-06 SURGICAL SUPPLY — 52 items
APPLIER CLIP 9.375 MED OPEN (MISCELLANEOUS) ×3
BINDER BREAST LRG (GAUZE/BANDAGES/DRESSINGS) IMPLANT
BINDER BREAST MEDIUM (GAUZE/BANDAGES/DRESSINGS) ×3 IMPLANT
BINDER BREAST XLRG (GAUZE/BANDAGES/DRESSINGS) IMPLANT
BINDER BREAST XXLRG (GAUZE/BANDAGES/DRESSINGS) IMPLANT
BLADE SURG 15 STRL LF DISP TIS (BLADE) ×1 IMPLANT
BLADE SURG 15 STRL SS (BLADE) ×2
CANISTER SUC SOCK COL 7IN (MISCELLANEOUS) IMPLANT
CANISTER SUCT 1200ML W/VALVE (MISCELLANEOUS) IMPLANT
CHLORAPREP W/TINT 26ML (MISCELLANEOUS) ×3 IMPLANT
CLIP APPLIE 9.375 MED OPEN (MISCELLANEOUS) ×1 IMPLANT
COVER BACK TABLE 60X90IN (DRAPES) ×3 IMPLANT
COVER MAYO STAND STRL (DRAPES) ×3 IMPLANT
COVER PROBE W GEL 5X96 (DRAPES) ×3 IMPLANT
DECANTER SPIKE VIAL GLASS SM (MISCELLANEOUS) IMPLANT
DEVICE DUBIN W/COMP PLATE 8390 (MISCELLANEOUS) ×3 IMPLANT
DRAPE LAPAROSCOPIC ABDOMINAL (DRAPES) ×3 IMPLANT
DRAPE UTILITY XL STRL (DRAPES) ×3 IMPLANT
ELECT COATED BLADE 2.86 ST (ELECTRODE) ×3 IMPLANT
ELECT REM PT RETURN 9FT ADLT (ELECTROSURGICAL) ×3
ELECTRODE REM PT RTRN 9FT ADLT (ELECTROSURGICAL) ×1 IMPLANT
GLOVE BIOGEL PI IND STRL 7.0 (GLOVE) ×1 IMPLANT
GLOVE BIOGEL PI IND STRL 8 (GLOVE) ×1 IMPLANT
GLOVE BIOGEL PI INDICATOR 7.0 (GLOVE) ×2
GLOVE BIOGEL PI INDICATOR 8 (GLOVE) ×2
GLOVE ECLIPSE 6.5 STRL STRAW (GLOVE) ×3 IMPLANT
GLOVE ECLIPSE 7.5 STRL STRAW (GLOVE) ×3 IMPLANT
GOWN STRL REUS W/ TWL LRG LVL3 (GOWN DISPOSABLE) ×1 IMPLANT
GOWN STRL REUS W/ TWL XL LVL3 (GOWN DISPOSABLE) ×1 IMPLANT
GOWN STRL REUS W/TWL LRG LVL3 (GOWN DISPOSABLE) ×2
GOWN STRL REUS W/TWL XL LVL3 (GOWN DISPOSABLE) ×2
KIT MARKER MARGIN INK (KITS) ×3 IMPLANT
LIQUID BAND (GAUZE/BANDAGES/DRESSINGS) ×3 IMPLANT
NDL SAFETY ECLIPSE 18X1.5 (NEEDLE) IMPLANT
NEEDLE HYPO 18GX1.5 SHARP (NEEDLE)
NEEDLE HYPO 25X1 1.5 SAFETY (NEEDLE) ×3 IMPLANT
NS IRRIG 1000ML POUR BTL (IV SOLUTION) ×3 IMPLANT
PACK BASIN DAY SURGERY FS (CUSTOM PROCEDURE TRAY) ×3 IMPLANT
PENCIL BUTTON HOLSTER BLD 10FT (ELECTRODE) ×3 IMPLANT
SLEEVE SCD COMPRESS KNEE MED (MISCELLANEOUS) ×3 IMPLANT
SPONGE LAP 4X18 X RAY DECT (DISPOSABLE) ×3 IMPLANT
SUT MNCRL AB 4-0 PS2 18 (SUTURE) ×3 IMPLANT
SUT SILK 2 0 SH (SUTURE) IMPLANT
SUT VIC AB 3-0 SH 27 (SUTURE) ×2
SUT VIC AB 3-0 SH 27X BRD (SUTURE) ×1 IMPLANT
SUT VICRYL 3-0 CR8 SH (SUTURE) ×3 IMPLANT
SYR CONTROL 10ML LL (SYRINGE) ×3 IMPLANT
TOWEL OR 17X24 6PK STRL BLUE (TOWEL DISPOSABLE) ×3 IMPLANT
TOWEL OR NON WOVEN STRL DISP B (DISPOSABLE) ×3 IMPLANT
TUBE CONNECTING 20'X1/4 (TUBING) ×1
TUBE CONNECTING 20X1/4 (TUBING) ×2 IMPLANT
YANKAUER SUCT BULB TIP NO VENT (SUCTIONS) ×3 IMPLANT

## 2014-09-06 NOTE — Interval H&P Note (Signed)
History and Physical Interval Note:  09/06/2014 10:09 AM  Brittney Horton  has presented today for surgery, with the diagnosis of left breast cancer  The various methods of treatment have been discussed with the patient and family. After consideration of risks, benefits and other options for treatment, the patient has consented to  Procedure(s): SEED LOCALIZATION LEFT BREAST LUMPECTOMY AND LEFT AXILLARY SENTINEL LYMPH NODE BIOPSY (Left) as a surgical intervention .  The patient's history has been reviewed, patient examined, no change in status, stable for surgery.  I have reviewed the patient's chart and labs.  Questions were answered to the patient's satisfaction.     [name] T

## 2014-09-06 NOTE — Op Note (Signed)
Preoperative Diagnosis: DCIS left breast  Postoprative Diagnosis: Same  Procedure: Procedure(s): Blue dye injection left breast, radioactive SEED LOCALIZATION LEFT BREAST LUMPECTOMY AND LEFT AXILLARY SENTINEL LYMPH NODE BIOPSY   Surgeon: Excell Seltzer T   Assistants: None  Anesthesia:  General LMA anesthesia  Indications: Patient is a 49 year old female who underwent a recent screening mammogram was found to have an area of distortion in the upper outer left breast. 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 After extensive discussion regarding treatment options and risks were elected proceed with radioactive seed localized left breast lumpectomy and also left axillary sentinel lymph node biopsy as the area is high grade.    Procedure Detail:  Patient had undergone accurate radioactive seed placement one day preoperatively. Seed placement was confirmed with the neoprobe in the holding area preoperatively. In the holding area she underwent injection of 1 mCi of technetium sulfur colloid intradermally around the left nipple. She was taken to the operating room, placed in the supine position on the operating table and laryngeal mask general anesthesia induced. Under sterile technique after patient timeout I injected 5 mL of dilute methylene blue subcutaneously beneath the left nipple and massaged this for several minutes. The entire left chest and axilla and upper arm were widely sterilely prepped and draped. Patient timeout was again performed and correct procedure verified. The lumpectomy was approached initially. The seed was localized and a curvilinear incision was made in the upper outer quadrant. Dissection was carried down through the subcutaneous tissue. Skin and subcutaneous flaps were raised for short distances in all directions. Using the neoprobe for guidance the dissection was deepened and a generous spherical specimen  of tissue was excised with cautery. Ex vivo the cecum was confirmed in the specimen. The specimen was oriented with ink. Specimen mammography showed the clip and the seed within the specimen with the marking clip appearing somewhat more toward the medial edge of the specimen. I did re-excise the medial as well as the lateral margin which were oriented with sutures and sent as separate specimens. The wound was irrigated and hemostasis obtained with cautery. The deep subcutaneous tissue was closed with interrupted 3-0 Vicryl. Attention was turned to the sentinel node. A hot area in the left axilla was localized and a small transverse incision made. Dissection was carried down through the subcutaneous tissue using cautery and the clavipectoral fascia incised. Using the neoprobe for guidance identified a blue lymphatic and dissection was carried down onto a bright blue lymph node with high counts. This was completely excised with cautery and ex vivo had counts of almost 3000. Background in the axilla was less than 30 and there were no palpable abnormal nodes. Hemostasis was obtained with cautery. The deep and subcutaneous tissue was closed with interrupted 3-0 Vicryl. Both skin incisions were closed with subcuticular 4-0 Monocryl and Dermabond. Sponge needle and instrument counts were correct.    Findings: As above  Estimated Blood Loss:  Minimal         Drains: none  Blood Given: none          Specimens: #1 left breast lumpectomy     #2 further medial margin oriented    #3 further lateral margin oriented      #4 hot blue left axillary sentinel lymph node        Complications:  * No complications entered in OR log *         Disposition: PACU -  hemodynamically stable.         Condition: stable

## 2014-09-06 NOTE — Anesthesia Postprocedure Evaluation (Signed)
  Anesthesia Post-op Note  Patient: Brittney Horton  Procedure(s) Performed: Procedure(s): SEED LOCALIZATION LEFT BREAST LUMPECTOMY AND LEFT AXILLARY SENTINEL LYMPH NODE BIOPSY (Left)  Patient Location: PACU  Anesthesia Type: General with regional  Level of Consciousness: awake, alert  and oriented  Airway and Oxygen Therapy: Patient Spontanous Breathing  Post-op Pain: mild  Post-op Assessment: Post-op Vital signs reviewed  Post-op Vital Signs: Reviewed  Last Vitals:  Filed Vitals:   09/06/14 1242  BP:   Pulse: 78  Temp: 36.4 C  Resp: 14    Complications: No apparent anesthesia complications

## 2014-09-06 NOTE — Progress Notes (Signed)
[facility] Patient Visit  REFERRING PROVIDER: Ellamae Sia, MD [place], [place] [address]  PRIMARY PROVIDER:  Ellamae Sia, MD  PRIMARY REASON FOR VISIT:  1. Breast cancer in situ, left   2. Family history of malignant neoplasm of breast     HISTORY OF PRESENT ILLNESS:   Brittney Horton, a 49 y.o. female, was seen for a [facility] cancer genetics consultation at the request of Dr. Quay Burow due to a personal and family history of cancer.  Brittney Horton presents to clinic today to discuss the possibility of a hereditary predisposition to cancer, genetic testing, and to further clarify her future cancer risks, as well as potential cancer risks for family members.   CANCER HISTORY:    Breast cancer of upper-outer quadrant of left female breast   07/16/2014 Breast MRI Left breast: 1.9 x 1.6 x 1.2 cm irregular area of low-grade non-mass enhancement, multiple small bilateral cysts   08/01/2014 Initial Diagnosis Left breast lower outer quadrant biopsy: DCIS with necrosis and calcifications grade 3 ER 0% PR 0%    Past Medical History  Diagnosis Date   Breast cancer    Hypertension    Headache     Past Surgical History  Procedure Laterality Date   Cesarean section      x5   Diagnostic laparoscopy      tubal preg    History   Social History   Marital Status: Single    Spouse Name: N/A    Number of Children: N/A   Years of Education: N/A   Social History Main Topics   Smoking status: Never Smoker    Smokeless tobacco: Not on file   Alcohol Use: Yes   Drug Use: No   Sexual Activity: Not on file   Other Topics Concern   Not on file   Social History Narrative     FAMILY HISTORY:  During the visit, a 4-generation pedigree was obtained. A copy of the pedigree with be scanned into Epic under the Media tab. Significant family history diagnoses include the following: Family History  Problem Relation  Age of Onset   Other Father     father died in his 67s and patient does not know any paternal family history   Cancer Cousin     mat first cousin diagnosed with breast cancer at a young age (female)   Brittney Horton ancestry is of Caucasian descent. There is no known Jewish ancestry or consanguinity.  GENETIC COUNSELING ASSESSMENT:  [name] is a 49 y.o. female with a personal and family history of cancer suggestive of a hereditary predisposition to cancer. We, therefore, discussed and recommended the following at today's visit.   DISCUSSION:  We reviewed the characteristics, features and inheritance patterns of hereditary cancer syndromes. We also discussed genetic testing, including the appropriate family members to test, the process of testing, insurance coverage and turn-around-time for results. We discussed the implications of a negative, positive and/or variant of uncertain significant result. We recommended Brittney Horton pursue genetic testing for the BreastNext gene panel. The BreastNext gene panel offered by [employer] includes sequencing and rearrangement analysis for the following 17 genes: ATM, BARD1, BRCA1, BRCA2, BRIP1, CDH1, CHEK2, MRE11A, MUTYH, NBN, NF1, PALB2, PTEN, RAD50, RAD51C, RAD51D, and TP53.  PLAN:  Based on our above recommendation, Brittney Horton wished to pursue genetic testing and the blood sample was drawn and will be sent to [facility] for  analysis. Results should be available within approximately 6 weeks time, at which point they will be disclosed by telephone to Brittney Horton, as will any additional recommendations warranted by these results. Lastly, we encouraged [name] to remain in contact with cancer genetics annually so that we can continuously update the family history and inform her of any changes in cancer genetics and testing that may be of benefit for this family.   Ms.  Horton questions were answered to her satisfaction today. Our contact  information was provided should additional questions or concerns arise. Thank you for the referral and allowing [place] to share in the care of your patient.   Catherine A. Fine, MS, CGC Certified Psychologist, [email or web address] phone: [phone]  The patient was seen for a total of 40 minutes in face-to-face genetic counseling.  This patient was discussed with Dr. Lindi Adie who agrees with the above.    ______________________________________________________________________ For Office Staff:  Number of people involved in session including genetic counselor: 2 Was an intern or student involved with case: not applicable

## 2014-09-06 NOTE — Anesthesia Preprocedure Evaluation (Signed)
Anesthesia Evaluation  Patient identified by MRN, date of birth, ID band Patient awake    Reviewed: Allergy & Precautions, NPO status , Patient's Chart, lab work & pertinent test results  Airway Mallampati: I  TM Distance: >3 FB Neck ROM: Full    Dental  (+) Teeth Intact, Dental Advisory Given   Pulmonary  breath sounds clear to auscultation        Cardiovascular hypertension, Pt. on medications and Pt. on home beta blockers Rhythm:Regular Rate:Normal     Neuro/Psych    GI/Hepatic   Endo/Other    Renal/GU      Musculoskeletal   Abdominal   Peds  Hematology   Anesthesia Other Findings   Reproductive/Obstetrics                             Anesthesia Physical Anesthesia Plan  ASA: II  Anesthesia Plan: General   Post-op Pain Management:    Induction: Intravenous  Airway Management Planned: LMA  Additional Equipment:   Intra-op Plan:   Post-operative Plan: Extubation in OR  Informed Consent: I have reviewed the patients History and Physical, chart, labs and discussed the procedure including the risks, benefits and alternatives for the proposed anesthesia with the patient or authorized representative who has indicated his/her understanding and acceptance.   Dental advisory given  Plan Discussed with: CRNA, Anesthesiologist and Surgeon  Anesthesia Plan Comments:         Anesthesia Quick Evaluation

## 2014-09-06 NOTE — H&P (Signed)
Suann Larry. Winslett 08/14/2014 8:01 AM Location: [facility] Patient #: [id number] DOB: Nov 10, 1965 Undefined / Language: Suszanne Conners / Race: Undefined Female  History of Present Illness Marland Kitchen T. Ipek Westra MD; 08/14/2014 11:18 AM) The patient is a 49 year old female who presents with breast cancer. She is a premenopausal female referred by Dr. Shon Hale for evaluation of recently diagnosed carcinoma of the left breast. She recently presented for a screening mamogram revealing a new area of direast.on in the upper outer quadrant of the left breast.. Subsequent imaging included diagnostic mamogram showing persistence of the area of distortion and ultrasound as negative. ssubsequently breast MRI was obtained which revealed a 1.9 x 1.2-1.6 cm area of irregular non-mass enhancement associated with the architectural distortion. MR guided biopsy was performed which has revealed high-grade DCIS.Marland Kitchen She is seen now in multidisciplinary clinic for initial treatment planning. She has experienced nno breast symptoms, specifically lump or pain or nipple discharge or skin changes. She does not have a personal history of any previous breast problems. Findings at that time were the following: Tumor size: 1.9 cm Tumor grade: high-grade Estrogen Receptor: negative Progesterone Receptor: egative Her-2 neu: negative Lymph node status: egative   Other Problems Marlowe Kays Goettsch; 08/14/2014 8:01 AM) Anxiety Disorder High blood pressure Migraine Headache Transfusion history  Past Surgical History Marlowe Kays Goettsch; 08/14/2014 8:01 AM) Breast Biopsy Left. Cesarean Section - Multiple  Diagnostic Studies History Mirian Mo; 08/14/2014 8:01 AM) Colonoscopy never Mammogram within last year Pap Smear 1-5 years ago  Social History Marlowe Kays Goettsch; 08/14/2014 8:01 AM) Alcohol use Moderate alcohol use. Caffeine use Coffee. No drug use Tobacco use Never smoker.  Family History Marlowe Kays Goettsch;  08/14/2014 8:01 AM) Alcohol Abuse Father. Arthritis Family Members In General. Cerebrovascular Accident Mother. Depression Mother. Diabetes Mellitus Mother. Hypertension Brother, Family Members In [place], Mother. Malignant Neoplasm Of Pancreas Family Members In General. Migraine Headache Brother, Mother. Seizure disorder Mother.  Pregnancy / Birth History Mirian Mo; 08/14/2014 8:01 AM) Age at menarche 68 years. Contraceptive History Depo-provera, Intrauterine device, Oral contraceptives. Gravida 8 Maternal age <15 Para 5 Regular periods  Review of Systems Marlowe Kays Goettsch; 08/14/2014 8:01 AM) General Present- Night Sweats. Not Present- Appetite Loss, Chills, Fatigue, Fever, Weight Gain and Weight Loss. Skin Present- Dryness. Not Present- Change in Wart/Mole, Hives, Jaundice, New Lesions, Non-Healing Wounds, Rash and Ulcer. HEENT Present- Nose Bleed. Not Present- Earache, Hearing Loss, Hoarseness, Oral Ulcers, Ringing in the Ears, Seasonal Allergies, Sinus Pain, Sore Throat, Visual Disturbances, Wears glasses/contact lenses and Yellow Eyes. Respiratory Not Present- Bloody sputum, Chronic Cough, Difficulty Breathing, Snoring and Wheezing. Breast Not Present- Breast Mass, Breast Pain, Nipple Discharge and Skin Changes. Cardiovascular Not Present- Chest Pain, Difficulty Breathing Lying Down, Leg Cramps, Palpitations, Rapid Heart Rate, Shortness of Breath and Swelling of Extremities. Gastrointestinal Not Present- Abdominal Pain, Bloating, Bloody Stool, Change in Bowel Habits, Chronic diarrhea, Constipation, Difficulty Swallowing, Excessive gas, Gets full quickly at meals, Hemorrhoids, Indigestion, Nausea, Rectal Pain and Vomiting. Female Genitourinary Present- Nocturia. Not Present- Frequency, Painful Urination, Pelvic Pain and Urgency. Musculoskeletal Not Present- Back Pain, Joint Pain, Joint Stiffness, Muscle Pain, Muscle Weakness and Swelling of  Extremities. Neurological Not Present- Decreased Memory, Fainting, Headaches, Numbness, Seizures, Tingling, Tremor, Trouble walking and Weakness. Psychiatric Not Present- Anxiety, Bipolar, Change in Sleep Pattern, Depression, Fearful and Frequent crying. Endocrine Not Present- Cold Intolerance, Excessive Hunger, Hair Changes, Heat Intolerance, Hot flashes and New Diabetes. Hematology Present- Easy Bruising. Not Present- Excessive bleeding, Gland problems, HIV and Persistent Infections.  Physical Exam Marland Kitchen T. Zola Runion MD; 08/14/2014 11:19 AM) The physical exam findings are as follows: Note:General: Alert, well-developed and well nourished female, in no distress Skin: Warm and dry without rash or infection. HEENT: No palpable masses or thyromegaly. Sclera nonicteric. Pupils equal round and reactive. Oropharynx clear. Lymph nodes: No cervical, supraclavicular, or inguinal nodes palpable. Breasts: I cannot feel any masses in either breast. No slary adenopathy.ipple discharge. No palpable axillary adenopathy. Lungs: Breath sounds clear and equal. No wheezing or increased work of breathing. Cardiovascular: Regular rate and rhythm without murmer. No JVD or edema. Peripheral pulses intact. No carotid bruits. Abdomen: Nondistended. Soft and nontender. No masses palpable. No organomegaly. No palpable hernias. Extremities: No edema or joint swelling or deformity. Neurologic: Alert and fully oriented. Gait normal. No focal weakness. Psychiatric: Normal mood and affect. Thought content appropriate with normal judgement and insight    Assessment & Plan Marland Kitchen T. Charistopher Rumble MD; 08/14/2014 11:22 AM) DCIS (DUCTAL CARCINOMA IN SITU), LEFT (233.0  D05.12) Impression: 49 year old female with a new diagnosis of cancer of the left breast, upper outer quadrant. Clinical stage 0, ER negative, PR nnegative. I discussed with the patient and family members present today initial surgical treatment options. We  discussed options of breast conservation with lumpectomy or total mastectomy and sentinal lymph node biopsy/dissection. Options for reconstruction were discussed. After discussion they have elected to proceed with left breast lumpectomy. due to the high-grade nature of the lesion and young patient age I recommended an axillary sentinel lymph node biopsy due to risk of finding invasive disease. She is in agreement. Genetic counseling is planned and we discussed scheduling her surgery after we have the results of her genetic testing in case she would desire prophylactic surgery if genetically positive. We discussed the indications and nature of the procedure, and expected recovery, in detail. Surgical risks including anesthetic complications, cardiorespiratory complications, bleeding, infection, wound healing complications, blood clots, lymphedema, local and distant recurrence and possible need for further surgery based on the final pathology was discussed and understood. Chemotherapy, hormonal therapy and radiation therapy have been discussed. They have been provided with literature regarding the treatment of breast cancer. All questions were answered. They understand and agree to proceed and we will go ahead with scheduling. Current Plans  Schedule for Surgery Seed localized left breast lumpectomy and left axillary sentinel lymph node biopsy.. We will schedule in about 3 weeks to allow results of genetic testing.   Signed by Edward Jolly, MD (08/14/2014 11:23 AM)

## 2014-09-06 NOTE — Transfer of Care (Signed)
Immediate Anesthesia Transfer of Care Note  Patient: Brittney Horton  Procedure(s) Performed: Procedure(s): SEED LOCALIZATION LEFT BREAST LUMPECTOMY AND LEFT AXILLARY SENTINEL LYMPH NODE BIOPSY (Left)  Patient Location: PACU  Anesthesia Type:GA combined with regional for post-op pain  Level of Consciousness: sedated  Airway & Oxygen Therapy: Patient Spontanous Breathing and Patient connected to face mask oxygen  Post-op Assessment: Report given to RN and Post -op Vital signs reviewed and stable  Post vital signs: Reviewed and stable  Last Vitals:  Filed Vitals:   09/06/14 1141  BP:   Pulse: 62  Temp:   Resp: 6    Complications: No apparent anesthesia complications

## 2014-09-06 NOTE — Progress Notes (Signed)
Assisted Dr. Crews with left, ultrasound guided, pectoralis block. Side rails up, monitors on throughout procedure. See vital signs in flow sheet. Tolerated Procedure well. 

## 2014-09-06 NOTE — Anesthesia Procedure Notes (Signed)
Procedure Name: LMA Insertion Date/Time: 09/06/2014 10:29 AM Performed by: Maryella Shivers Pre-anesthesia Checklist: Patient identified, Emergency Drugs available, Suction available and Patient being monitored Patient Re-evaluated:Patient Re-evaluated prior to inductionOxygen Delivery Method: Circle System Utilized Preoxygenation: Pre-oxygenation with 100% oxygen Intubation Type: IV induction Ventilation: Mask ventilation without difficulty LMA: LMA inserted LMA Size: 4.0 Number of attempts: 1 Airway Equipment and Method: Bite block Placement Confirmation: positive ETCO2 Tube secured with: Tape Dental Injury: Teeth and Oropharynx as per pre-operative assessment

## 2014-09-06 NOTE — Discharge Instructions (Signed)
[facility] °Office Phone Number [phone] ° °BREAST BIOPSY/ PARTIAL MASTECTOMY: POST OP INSTRUCTIONS ° °Always review your discharge instruction sheet given to you by the facility where your surgery was performed. ° °IF YOU HAVE DISABILITY OR FAMILY LEAVE FORMS, YOU MUST BRING THEM TO THE OFFICE FOR PROCESSING.  DO NOT GIVE THEM TO YOUR DOCTOR. ° °1. A prescription for pain medication may be given to you upon discharge.  Take your pain medication as prescribed, if needed.  If narcotic pain medicine is not needed, then you may take acetaminophen (Tylenol) or ibuprofen (Advil) as needed. °2. Take your usually prescribed medications unless otherwise directed °3. If you need a refill on your pain medication, please contact your pharmacy.  They will contact our office to request authorization.  Prescriptions will not be filled after 5pm or on week-ends. °4. You should eat very light the first 24 hours after surgery, such as soup, crackers, pudding, etc.  Resume your normal diet the day after surgery. °5. Most patients will experience some swelling and bruising in the breast.  Ice packs and a good support bra will help.  Swelling and bruising can take several days to resolve.  °6. It is common to experience some constipation if taking pain medication after surgery.  Increasing fluid intake and taking a stool softener will usually help or prevent this problem from occurring.  A mild laxative (Milk of Magnesia or Miralax) should be taken according to package directions if there are no bowel movements after 48 hours. °7. Unless discharge instructions indicate otherwise, you may remove your bandages 24-48 hours after surgery, and you may shower at that time.  You may have steri-strips (small skin tapes) in place directly over the incision.  These strips should be left on the skin for 7-10 days.  If your surgeon used skin glue on the incision, you may shower in 24 hours.  The glue will flake off over the  next 2-3 weeks.  Any sutures or staples will be removed at the office during your follow-up visit. °8. ACTIVITIES:  You may resume regular daily activities (gradually increasing) beginning the next day.  Wearing a good support bra or sports bra minimizes pain and swelling.  You may have sexual intercourse when it is comfortable. °a. You may drive when you no longer are taking prescription pain medication, you can comfortably wear a seatbelt, and you can safely maneuver your car and apply brakes. °b. RETURN TO WORK:  ______________________________________________________________________________________ °9. You should see your doctor in the office for a follow-up appointment approximately two weeks after your surgery.  Your doctor’s nurse will typically make your follow-up appointment when she calls you with your pathology report.  Expect your pathology report 2-3 business days after your surgery.  You may call to check if you do not hear from us after three days. °10. OTHER INSTRUCTIONS: _______________________________________________________________________________________________ _____________________________________________________________________________________________________________________________________ °_____________________________________________________________________________________________________________________________________ °_____________________________________________________________________________________________________________________________________ ° °WHEN TO CALL YOUR DOCTOR: °1. Fever over 101.0 °2. Nausea and/or vomiting. °3. Extreme swelling or bruising. °4. Continued bleeding from incision. °5. Increased pain, redness, or drainage from the incision. ° °The clinic staff is available to answer your questions during regular business hours.  Please don’t hesitate to call and ask to speak to one of the nurses for clinical concerns.  If you have a medical emergency, go to the nearest  emergency room or call 911.  A surgeon from [facility] is always on call at the hospital. ° °For further questions, please visit [email or web address]  ° ° °  Post Anesthesia Home Care Instructions ° °Activity: °Get plenty of rest for the remainder of the day. A responsible adult should stay with you for 24 hours following the procedure.  °For the next 24 hours, DO NOT: °-Drive a car °-Operate machinery °-Drink alcoholic beverages °-Take any medication unless instructed by your physician °-Make any legal decisions or sign important papers. ° °Meals: °Start with liquid foods such as gelatin or soup. Progress to regular foods as tolerated. Avoid greasy, spicy, heavy foods. If nausea and/or vomiting occur, drink only clear liquids until the nausea and/or vomiting subsides. Call your physician if vomiting continues. ° °Special Instructions/Symptoms: °Your throat may feel dry or sore from the anesthesia or the breathing tube placed in your throat during surgery. If this causes discomfort, gargle with warm salt water. The discomfort should disappear within 24 hours. ° °

## 2014-09-06 NOTE — Progress Notes (Signed)
Radiology staff performed nuc med injection. No additional sedation required. VSS (see doc flowsheet). Pt tol well, will call family to bedside

## 2014-09-23 ENCOUNTER — Ambulatory Visit
Admission: RE | Admit: 2014-09-23 | Discharge: 2014-09-23 | Disposition: A | Discharge status: Home / Self Care | Payer: BLUE CROSS/BLUE SHIELD | Source: Ambulatory Visit | Attending: Radiation Oncology | Admitting: Radiation Oncology

## 2014-09-23 ENCOUNTER — Encounter: Payer: Self-pay | Admitting: Radiation Oncology

## 2014-09-23 VITALS — BP 147/92 | HR 88 | Temp 98.0°F | Resp 20 | Ht 64.0 in | Wt 141.7 lb

## 2014-09-23 DIAGNOSIS — L814 Other melanin hyperpigmentation: Secondary | ICD-10-CM | POA: Insufficient documentation

## 2014-09-23 DIAGNOSIS — D0512 Intraductal carcinoma in situ of left breast: Secondary | ICD-10-CM | POA: Insufficient documentation

## 2014-09-23 DIAGNOSIS — L539 Erythematous condition, unspecified: Secondary | ICD-10-CM | POA: Insufficient documentation

## 2014-09-23 DIAGNOSIS — L599 Disorder of the skin and subcutaneous tissue related to radiation, unspecified: Secondary | ICD-10-CM | POA: Diagnosis not present

## 2014-09-23 DIAGNOSIS — Z510 Encounter for antineoplastic radiation therapy: Secondary | ICD-10-CM | POA: Diagnosis not present

## 2014-09-23 DIAGNOSIS — C50412 Malignant neoplasm of upper-outer quadrant of left female breast: Secondary | ICD-10-CM

## 2014-09-23 DIAGNOSIS — Z79891 Long term (current) use of opiate analgesic: Secondary | ICD-10-CM | POA: Diagnosis not present

## 2014-09-23 HISTORY — DX: Anxiety disorder, unspecified: F41.9

## 2014-09-23 NOTE — Progress Notes (Signed)
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 Diagnosis(continued)2. Breast, excision, Left additional medial margin- ATYPICAL DUCTAL HYPERPLASIA. - FIBROCYSTIC CHANGES WITH USUAL DUCTAL HYPERPLASIA, ADENOSIS ANDCALCIFICATIONS.- SEE COMMENT. 3. Breast, excision, Left additional lateral margin- FIBROCYSTIC CHANGES WITH ADENOSIS AND CALCIFICATIONS.- THERE IS NO EVIDENCE OF MALIGNANCY.- SEE COMMENT.4. Lymph node, sentinel, biopsy, Left axillary - THERE IS NO EVIDENCE OF CARCINOMA IN 1 OF 1 LYMPH NODE (0/1). Dr. Benjamin Hoxworth,MD  Past/Anticipated interventions by medical oncology, if any: Chemotherapy Seen in Breast Clinic1/ 13/16 Dr. Vinay Gudena ,f/u 09/25/14   Lymphedema issues, if any: None  Pain issues, if any:  Breast tenderness and soreness on braset and under axilla, numbness at nipple area/incisions  well healed   SAFETY ISSUES:  Prior radiation? NO  Pacemaker/ICD? NO  Possible current pregnancy? NO  Is the patient on methotrexate?  NO  Current Complaints / other details:  Single,  menarche age 13, G8P5, Maternal age  1st pregnancy age [age], , hx Depo-provera,Intrauterine device, oral contraceptives, moderate alcohol use, non smoker,   Allergies: NKDA    McElroy, Janice Bruner, RN 09/23/2014,7:52 AM   

## 2014-09-23 NOTE — Progress Notes (Signed)
Please see the Nurse Progress Note in the MD Initial Consult Encounter for this patient. 

## 2014-09-23 NOTE — Progress Notes (Signed)
Radiation Oncology         [phone]) [phone] ________________________________  Name: Brittney Horton MRN: [id number]  Date: 09/23/2014  DOB: [birth year]  Follow-Up Visit Note  CC: Ellamae Sia, MD  Excell Seltzer, MD  Diagnosis:    Breast cancer of upper-outer quadrant of left female breast   Staging form: Breast, AJCC 7th Edition     Clinical: Stage 0 (Tis (DCIS), N0, M0) - Unsigned       Staging comments: Staged at breast conference on 1.13.16      Pathologic stage from 09/09/2014: Stage 0 (Tis (DCIS), N0, cM0) - Unsigned       Staging comments: Staged on final lumpectomy specimen by Dr. Lyndon Code    Narrative:  The patient returns today for follow-up.  She has completed lumpectomy with negative margins and now presents for further discussion and coordination of an anticipated course of radiation treatment. The patient has seen medical oncology. She will not receive chemotherapy with a final diagnosis of ductal carcinoma in situ. The patient will not be receiving anti-hormonal treatment after radiation treatment.  The patient states that she is doing well in terms of recovery. The patient's pain is improving. She denies any difficulties with wound healing/infection.              ALLERGIES:  has No Known Allergies.  Meds: Current Outpatient Prescriptions  Medication Sig Dispense Refill  . metoprolol succinate (TOPROL-XL) 50 MG 24 hr tablet Take 50 mg by mouth daily.   2  . SUMAtriptan (IMITREX) 50 MG tablet Take 50 mg by mouth as needed. For migraines  5  . traMADol (ULTRAM) 50 MG tablet Take by mouth.    Marland Kitchen amLODipine (NORVASC) 5 MG tablet Take 5 mg by mouth daily.   2  . HYDROcodone-acetaminophen (NORCO/VICODIN) 5-325 MG per tablet Take 1-2 tablets by mouth every 4 (four) hours as needed for moderate pain or severe pain. (Patient not taking: Reported on 09/23/2014) 30 tablet 0   No current facility-administered medications for this encounter.    Physical Findings: The patient is  in no acute distress. Patient is alert and oriented.  height is $RemoveB'5\' 4"'SGhiilyi$  (1.626 m) and weight is 141 lb 11.2 oz (64.275 kg). Her oral temperature is 98 F (36.7 C). Her blood pressure is 147/92 and her pulse is 88. Her respiration is 20 and oxygen saturation is 100%. .   The surgical incision looks very good with no signs of infection/wound healing difficulties.  Lab Findings: Lab Results  Component Value Date   WBC 7.8 08/14/2014   HGB 13.8 08/14/2014   HCT 41.8 08/14/2014   MCV 93.0 08/14/2014   PLT 305 08/14/2014     Radiographic Findings: Nm Sentinel Node Inj-no Rpt (breast)  09/06/2014   CLINICAL DATA:    Sulfur colloid was injected intradermally by the nuclear medicine  technologist for breast cancer sentinel node localization.    Mm Breast Surgical Specimen  09/06/2014   CLINICAL DATA:  Radioactive seed localization of biopsy-proven DCIS in the upper outer quadrant of the left breast yesterday. Patient underwent lumpectomy today.  EXAM: SPECIMEN RADIOGRAPH OF THE LEFT BREAST  COMPARISON:  Previous exam(s).  FINDINGS: Status post excision of the left breast. The radioactive seed and biopsy marker clip are present, completely intact, and are marked for pathology. This was discussed directly by telephone with Claiborne Billings, the operating room nurse, at the time of interpretation on 09/06/2014 at 1100 hr.  IMPRESSION: Specimen radiograph of the left breast.  Electronically Signed   By: Evangeline Dakin M.D.   On: 09/06/2014 11:07   Mm Lt Radioactive Seed Loc Mammo Guide  09/05/2014   CLINICAL DATA:  Patient is a 49 year old female with recent diagnosis of DCIS on MR guided core needle biopsy. Post biopsy mammography demonstrated the hourglass shaped clip adjacent to the area of distortion in the upper outer quadrant. The clip was noted to be approximately 1 cm anterior, inferior, and medial to the distortion.  EXAM: MAMMOGRAPHIC GUIDED RADIOACTIVE SEED LOCALIZATION OF THE LEFT BREAST  COMPARISON:   Previous exam(s)  FINDINGS: Patient presents for radioactive seed localization prior to lumpectomy. I met with the patient and we discussed the procedure of seed localization including benefits and alternatives. We discussed the high likelihood of a successful procedure. We discussed the risks of the procedure including infection, bleeding, tissue injury and further surgery. We discussed the low dose of radioactivity involved in the procedure. Informed, written consent was given.  The usual time-out protocol was performed immediately prior to the procedure.  Using mammographic guidance, sterile technique, 2% lidocaine and an I-125 radioactive seed, the clip and area of distortion was localized using a lateral approach. The seed was placed in between the distortion and metal tissue marker, approximately 9 mm lateral and slightly posterior to the hourglass shaped metal tissue marker. The follow-up mammogram images confirm the seed in the expected location.  Follow-up survey of the patient confirms presence of radioactive seed.  Order number of I-125 seed:  [id number].  Total activity:  0.245 mCi  reference Date: 07/09/2014  The patient tolerated the procedure well and was released from the [facility]. She was given instructions regarding seed removal.  IMPRESSION: Radioactive seed localization the left breast. No apparent complications.   Electronically Signed   By: Andres Shad   On: 09/05/2014 11:58    Impression:     Breast cancer of upper-outer quadrant of left female breast   Staging form: Breast, AJCC 7th Edition     Clinical: Stage 0 (Tis (DCIS), N0, M0) - Unsigned       Staging comments: Staged at breast conference on 1.13.16      Pathologic stage from 09/09/2014: Stage 0 (Tis (DCIS), N0, cM0) - Unsigned       Staging comments: Staged on final lumpectomy specimen by Dr. Lyndon Code    The patient has completed surgery. She is suitable to proceed with adjuvant radiotherapy at this time.  I once again  discussed the rationale for radiation treatment in this setting. We discussed the potential benefit of radiation treatment in terms of local/regional control. We also discussed the possible side effects and risks of a possible course of radiation treatment. All of her questions were answered.  The patient does wish to proceed with radiation treatment.  Plan:  The patient will be scheduled for a simulation in the near future such that we can proceed with radiation treatment planning. I anticipate treating the patientfor approximately 6-1/2 weeks to the left breast using whole breast tangent radiation fields for the patient's diagnosis of ductal carcinoma in situ.  I spent 20 minutes with the patient today, the majority of which was spent counseling the patient on the diagnosis of cancer and coordinating care. ------------------------------------------------  Jodelle Gross, MD, PhD

## 2014-09-24 ENCOUNTER — Encounter: Payer: Self-pay | Admitting: *Deleted

## 2014-09-24 NOTE — Progress Notes (Signed)
[place] Psychosocial Distress Screening Clinical Social Work  Clinical Social Work was referred by distress screening protocol.  The patient scored a 7 on the Psychosocial Distress Thermometer which indicates severe distress. Clinical Social Worker phoned pt to assess for distress and other psychosocial needs. CSW left vm for pt stating role of CSW. CSW awaits return call and will try to see pt at future appointments as well.   ONCBCN DISTRESS SCREENING 09/23/2014  Screening Type Initial Screening  Distress experienced in past week (1-10) 7  Practical problem type Insurance;Work/school  Emotional problem type Adjusting to illness  Spiritual/Religous concerns type   Information Concerns Type Lack of info about treatment;Lack of info about maintaining fitness  Physical Problem type Pain  Physician notified of physical symptoms Yes  Referral to clinical social work Yes  Referral to support programs     Clinical Social Worker follow up needed: Yes.    If yes, follow up plan: See above Loren Racer, [place] Worker Organ  [facility] Phone: [phone] Fax: [phone]

## 2014-09-25 ENCOUNTER — Telehealth: Payer: Self-pay | Admitting: *Deleted

## 2014-09-25 ENCOUNTER — Ambulatory Visit: Payer: BLUE CROSS/BLUE SHIELD | Admitting: Hematology and Oncology

## 2014-09-25 ENCOUNTER — Telehealth: Payer: Self-pay | Admitting: Hematology and Oncology

## 2014-09-25 NOTE — Telephone Encounter (Signed)
Patient called to say she wants to cancel her appointment with Dr. Lindi Adie that is scheduled this afternoon at 3:15.  She asked that someone call her to reschedule.  She does not want to get out in the bad weather.

## 2014-09-25 NOTE — Telephone Encounter (Signed)
Recd a inbox/staff message that pt called in to cx todays appt due to weather,i have left a message with a new appt  anne

## 2014-09-25 NOTE — Telephone Encounter (Signed)
, °

## 2014-09-30 ENCOUNTER — Encounter: Payer: Self-pay | Admitting: Genetic Counselor

## 2014-09-30 DIAGNOSIS — Z803 Family history of malignant neoplasm of breast: Secondary | ICD-10-CM

## 2014-09-30 DIAGNOSIS — D0592 Unspecified type of carcinoma in situ of left breast: Secondary | ICD-10-CM

## 2014-09-30 NOTE — Progress Notes (Signed)
[facility]  Genetic Test Results PRIMARY PROVIDER:  Ellamae Sia, MD  PRIMARY REASON FOR VISIT:  Personal history of breast cancer  GENETIC TEST RESULT:  Testing Laboratory: [facility]  Test Ordered: BreastNext gene panel Date of Report: 09/21/2014 Result: Normal, no pathogenic mutations identified General Interpretation: Reassuring  HPI: Ms. Lanza was previously seen in the [facility] due to concerns regarding a hereditary predisposition to cancer. Please refer to our prior cancer genetics clinic note for more information regarding Ms. Lien's medical, social and family histories, and our assessment and recommendations, at the time. Ms. Nall genetic test results and recommendations warranted by these results were recently disclosed to her and are discussed in more detail below.  GENETIC TEST RESULTS: At the time of Ms. Bedoya's visit, we recommended she pursue genetic testing, which includes sequencing and deletion/duplication analysis of several genes associated with an increased risk for cancer via a gene panel. The BreastNext gene panel offered by [employer] includes sequencing and rearrangement analysis for the following 17 genes: ATM, BARD1, BRCA1, BRCA2, BRIP1, CDH1, CHEK2, MRE11A, MUTYH, NBN, NF1, PALB2, PTEN, RAD50, RAD51C, RAD51D, and TP53. Genetic testing for this gene panel was normal and did not reveal a pathogenic mutation in any of these genes. A copy of the genetic test report will be scanned into Epic under the media tab.   We discussed with Ms. Truluck that current genetic testing is not perfect, and it is, therefore, possible there may be a pathogenic gene mutation in one of these genes that current testing cannot detect, but that chance is small.  We also discussed, that it is possible that another gene that has not yet been discovered, or that we have not yet tested, is responsible for the  cancer diagnoses in her family. It is, therefore, important for Ms. Sparano to continue to remain in touch with cancer genetics so that we can continue to offer Ms. Falcon the most up to date genetic testing.   CANCER SCREENING RECOMMENDATIONS: This result is reassuring and indicates that Ms. Glassner likely does not have an increased risk for a future cancer due to a mutation in one of these genes. This normal test also suggests that Ms. Elsea's cancer was most likely not due to an inherited predisposition associated with one of these genes.  Most cancers happen by chance and this negative test suggests that her cancer falls into this category.  We, therefore, recommended she continue to follow the cancer management and screening guidelines provided by her oncology and primary healthcare provider.  er clinic; in addition to a yearly mammogram and physical exam by a healthcare provider, she should discuss the   RECOMMENDATIONS FOR FAMILY MEMBERS:   Individuals in this family might be at some increased risk of developing cancer, over the general population risk, simply due to the family history of cancer.  We recommended women in this family have a yearly mammogram beginning at age 109, an annual clinical breast exam, and perform monthly breast self-exams. Women in this family should also have a gynecological exam as recommended by their primary provider. All family members should have a colonoscopy by age 75, an annual dermatological exam and continue to follow cancer screening guidelines recommended by their healthcare provider.  FOLLOW-UP: Lastly, we discussed with Ms. Dinunzio that cancer genetics is a rapidly advancing field and it is likely that new genetic tests will be appropriate for her and/or family members in  the future. We encouraged her to remain in contact with cancer genetics on an annual basis so we can update her personal and family histories and let her know of advances in cancer genetics that  may benefit this family.   Our contact number was provided. Ms. Overacker questions were answered to her satisfaction, and she knows she is welcome to call us at anytime with additional questions or concerns.    Catherine A. Fine, MS, CGC Certified Psychologist, [email or web address] Phone: [phone]

## 2014-10-02 ENCOUNTER — Telehealth: Payer: Self-pay | Admitting: Hematology and Oncology

## 2014-10-02 ENCOUNTER — Ambulatory Visit
Admission: RE | Admit: 2014-10-02 | Discharge: 2014-10-02 | Disposition: A | Discharge status: Home / Self Care | Payer: BLUE CROSS/BLUE SHIELD | Source: Ambulatory Visit | Attending: Radiation Oncology | Admitting: Radiation Oncology

## 2014-10-02 DIAGNOSIS — Z510 Encounter for antineoplastic radiation therapy: Secondary | ICD-10-CM | POA: Diagnosis not present

## 2014-10-02 DIAGNOSIS — C50412 Malignant neoplasm of upper-outer quadrant of left female breast: Secondary | ICD-10-CM

## 2014-10-02 NOTE — Telephone Encounter (Signed)
pt called in to r/s her dr Lindi Adie appt,done

## 2014-10-03 NOTE — Progress Notes (Signed)
  Radiation Oncology         [phone] ________________________________  Name: ANASTAISA WOODING MRN: [id number]  Date: 10/02/2014  DOB: [birth year]  SIMULATION AND TREATMENT PLANNING NOTE  The patient presented for simulation prior to beginning her course of radiation treatment for her diagnosis of left-sided breast cancer. The patient was placed in a supine position on a breast board. A customized vac-lock bag was constructed and this complex treatment device will be used on a daily basis during her treatment. In this fashion, a CT scan was obtained through the chest area and an isocenter was placed near the chest wall within the breast.  The patient will be planned to receive a course of radiation initially to a dose of 50.4 Gy. This will consist of a whole breast radiotherapy technique. To accomplish this, 2 customized blocks have been designed which will correspond to medial and lateral whole breast tangent fields. This treatment will be accomplished at 1.8 Gy per fraction. A forward planning technique will also be evaluated to determine if this approach improves the plan. It is anticipated that the patient will then receive a 10 Gy boost to the seroma cavity which has been contoured. This will be accomplished at 2 Gy per fraction.   This initial treatment will consist of a 3-D conformal technique. The seroma has been contoured as the primary target structure. Additionally, dose volume histograms of both this target as well as the lungs and heart will also be evaluated. Such an approach is necessary to ensure that the target area is adequately covered while the nearby critical  normal structures are adequately spared.  Plan:  The final anticipated total dose therefore will correspond to 60.4 Gy.    _______________________________   Jodelle Gross, MD, PhD

## 2014-10-07 DIAGNOSIS — Z510 Encounter for antineoplastic radiation therapy: Secondary | ICD-10-CM | POA: Diagnosis not present

## 2014-10-09 ENCOUNTER — Ambulatory Visit
Admission: RE | Admit: 2014-10-09 | Discharge: 2014-10-09 | Disposition: A | Discharge status: Home / Self Care | Payer: BLUE CROSS/BLUE SHIELD | Source: Ambulatory Visit | Attending: Radiation Oncology | Admitting: Radiation Oncology

## 2014-10-09 DIAGNOSIS — Z510 Encounter for antineoplastic radiation therapy: Secondary | ICD-10-CM | POA: Diagnosis not present

## 2014-10-09 DIAGNOSIS — C50412 Malignant neoplasm of upper-outer quadrant of left female breast: Secondary | ICD-10-CM

## 2014-10-09 MED ORDER — RADIAPLEXRX EX GEL
Freq: Once | CUTANEOUS | Status: AC
  Administered 2014-10-09: 14:00:00 via TOPICAL

## 2014-10-09 MED ORDER — ALRA NON-METALLIC DEODORANT (RAD-ONC)
1.0000 "application " | Freq: Once | TOPICAL | Status: AC
  Administered 2014-10-09: 1 via TOPICAL

## 2014-10-09 NOTE — Progress Notes (Signed)
Patient education completed with patient. Gave her "Radiation and You " booklet with all pertinent information marked and discussed, re: fatigue, skin irritation/care, nutrition, pain. Gave her Radiaplex, [place] with instructions for proper use. Teach back method used. Patient verbalized understanding.

## 2014-10-10 ENCOUNTER — Ambulatory Visit
Admission: RE | Admit: 2014-10-10 | Discharge: 2014-10-10 | Disposition: A | Discharge status: Home / Self Care | Payer: BLUE CROSS/BLUE SHIELD | Source: Ambulatory Visit | Attending: Radiation Oncology | Admitting: Radiation Oncology

## 2014-10-10 ENCOUNTER — Ambulatory Visit: Admission: RE | Admit: 2014-10-10 | Payer: BLUE CROSS/BLUE SHIELD | Source: Ambulatory Visit

## 2014-10-10 DIAGNOSIS — Z510 Encounter for antineoplastic radiation therapy: Secondary | ICD-10-CM | POA: Diagnosis not present

## 2014-10-11 ENCOUNTER — Ambulatory Visit
Admission: RE | Admit: 2014-10-11 | Discharge: 2014-10-11 | Disposition: A | Discharge status: Home / Self Care | Payer: BLUE CROSS/BLUE SHIELD | Source: Ambulatory Visit | Attending: Radiation Oncology | Admitting: Radiation Oncology

## 2014-10-11 ENCOUNTER — Encounter: Payer: Self-pay | Admitting: Radiation Oncology

## 2014-10-11 DIAGNOSIS — Z510 Encounter for antineoplastic radiation therapy: Secondary | ICD-10-CM | POA: Diagnosis not present

## 2014-10-14 ENCOUNTER — Ambulatory Visit: Payer: BLUE CROSS/BLUE SHIELD | Admitting: Hematology and Oncology

## 2014-10-14 ENCOUNTER — Ambulatory Visit
Admission: RE | Admit: 2014-10-14 | Discharge: 2014-10-14 | Disposition: A | Discharge status: Home / Self Care | Payer: BLUE CROSS/BLUE SHIELD | Source: Ambulatory Visit | Attending: Radiation Oncology | Admitting: Radiation Oncology

## 2014-10-14 DIAGNOSIS — Z510 Encounter for antineoplastic radiation therapy: Secondary | ICD-10-CM | POA: Diagnosis not present

## 2014-10-15 ENCOUNTER — Ambulatory Visit
Admission: RE | Admit: 2014-10-15 | Discharge: 2014-10-15 | Disposition: A | Discharge status: Home / Self Care | Payer: BLUE CROSS/BLUE SHIELD | Source: Ambulatory Visit | Attending: Radiation Oncology | Admitting: Radiation Oncology

## 2014-10-15 DIAGNOSIS — Z510 Encounter for antineoplastic radiation therapy: Secondary | ICD-10-CM | POA: Diagnosis not present

## 2014-10-16 ENCOUNTER — Ambulatory Visit
Admission: RE | Admit: 2014-10-16 | Discharge: 2014-10-16 | Disposition: A | Discharge status: Home / Self Care | Payer: BLUE CROSS/BLUE SHIELD | Source: Ambulatory Visit | Attending: Radiation Oncology | Admitting: Radiation Oncology

## 2014-10-16 ENCOUNTER — Encounter: Payer: Self-pay | Admitting: Radiation Oncology

## 2014-10-16 VITALS — BP 114/79 | HR 71 | Temp 98.1°F | Resp 20 | Wt 140.1 lb

## 2014-10-16 DIAGNOSIS — Z510 Encounter for antineoplastic radiation therapy: Secondary | ICD-10-CM | POA: Diagnosis not present

## 2014-10-16 DIAGNOSIS — C50412 Malignant neoplasm of upper-outer quadrant of left female breast: Secondary | ICD-10-CM

## 2014-10-16 NOTE — Progress Notes (Signed)
   Department of Radiation Oncology  Phone:  [phone] Fax:        [phone]  Weekly Treatment Note    Name: Brittney Horton Date: 10/16/2014 MRN: [id number] DOB: 1965-12-08   Current dose: 9 Gy  Current fraction: 5   MEDICATIONS: Current Outpatient Prescriptions  Medication Sig Dispense Refill  . amLODipine (NORVASC) 5 MG tablet Take 5 mg by mouth daily.   2  . hyaluronate sodium (RADIAPLEXRX) GEL Apply 1 application topically 2 (two) times daily.    Marland Kitchen HYDROcodone-acetaminophen (NORCO/VICODIN) 5-325 MG per tablet Take 1-2 tablets by mouth every 4 (four) hours as needed for moderate pain or severe pain. (Patient not taking: Reported on 09/23/2014) 30 tablet 0  . metoprolol succinate (TOPROL-XL) 50 MG 24 hr tablet Take 50 mg by mouth daily.   2  . SUMAtriptan (IMITREX) 50 MG tablet Take 50 mg by mouth as needed. For migraines  5  . traMADol (ULTRAM) 50 MG tablet Take by mouth.     No current facility-administered medications for this encounter.     ALLERGIES: Review of patient's allergies indicates no known allergies.   LABORATORY DATA:  Lab Results  Component Value Date   WBC 7.8 08/14/2014   HGB 13.8 08/14/2014   HCT 41.8 08/14/2014   MCV 93.0 08/14/2014   PLT 305 08/14/2014   Lab Results  Component Value Date   NA 142 08/14/2014   K 3.8 08/14/2014   CO2 28 08/14/2014   Lab Results  Component Value Date   ALT 10 08/14/2014   AST 14 08/14/2014   ALKPHOS 95 08/14/2014   BILITOT 0.46 08/14/2014     NARRATIVE: Brittney Horton was seen today for weekly treatment management. The chart was checked and the patient's films were reviewed.  Weekly rad tx left breast 5 completed, no skin changes, using radipalex bid, nipple area painful at times, appetite good, tired this afternoon  PHYSICAL EXAMINATION: weight is 140 lb 1.6 oz (63.549 kg). Her oral temperature is 98.1 F (36.7 C). Her blood pressure is 114/79 and her pulse is 71. Her respiration is 20.         ASSESSMENT: The patient is doing satisfactorily with treatment.  PLAN: We will continue with the patient's radiation treatment as planned.

## 2014-10-16 NOTE — Progress Notes (Signed)
Weekly rad tx left breast 5 completed, no skin changes, using radipalex bid, nipple area painful at times, appetite good, tired this afternoon 2:14 PM

## 2014-10-17 ENCOUNTER — Ambulatory Visit
Admission: RE | Admit: 2014-10-17 | Discharge: 2014-10-17 | Disposition: A | Discharge status: Home / Self Care | Payer: BLUE CROSS/BLUE SHIELD | Source: Ambulatory Visit | Attending: Radiation Oncology | Admitting: Radiation Oncology

## 2014-10-17 DIAGNOSIS — Z510 Encounter for antineoplastic radiation therapy: Secondary | ICD-10-CM | POA: Diagnosis not present

## 2014-10-18 ENCOUNTER — Encounter: Payer: Self-pay | Admitting: *Deleted

## 2014-10-18 ENCOUNTER — Ambulatory Visit
Admission: RE | Admit: 2014-10-18 | Discharge: 2014-10-18 | Disposition: A | Discharge status: Home / Self Care | Payer: BLUE CROSS/BLUE SHIELD | Source: Ambulatory Visit | Attending: Radiation Oncology | Admitting: Radiation Oncology

## 2014-10-18 ENCOUNTER — Ambulatory Visit: Payer: BLUE CROSS/BLUE SHIELD | Admitting: Radiation Oncology

## 2014-10-18 VITALS — BP 122/78 | HR 73 | Temp 98.0°F | Resp 20 | Wt 139.0 lb

## 2014-10-18 DIAGNOSIS — C50412 Malignant neoplasm of upper-outer quadrant of left female breast: Secondary | ICD-10-CM

## 2014-10-18 DIAGNOSIS — Z510 Encounter for antineoplastic radiation therapy: Secondary | ICD-10-CM | POA: Diagnosis not present

## 2014-10-18 NOTE — Progress Notes (Signed)
   Department of Radiation Oncology  Phone:  [phone] Fax:        [phone]  Weekly Treatment Note    Name: Brittney Horton Date: 10/18/2014 MRN: [id number] DOB: November 15, 1965   Current dose: 12.6 Gy  Current fraction: 7   MEDICATIONS: Current Outpatient Prescriptions  Medication Sig Dispense Refill  . amLODipine (NORVASC) 5 MG tablet Take 5 mg by mouth daily.   2  . hyaluronate sodium (RADIAPLEXRX) GEL Apply 1 application topically 2 (two) times daily.    . metoprolol succinate (TOPROL-XL) 50 MG 24 hr tablet Take 50 mg by mouth daily.   2  . HYDROcodone-acetaminophen (NORCO/VICODIN) 5-325 MG per tablet Take 1-2 tablets by mouth every 4 (four) hours as needed for moderate pain or severe pain. (Patient not taking: Reported on 09/23/2014) 30 tablet 0  . SUMAtriptan (IMITREX) 50 MG tablet Take 50 mg by mouth as needed. For migraines  5  . traMADol (ULTRAM) 50 MG tablet Take by mouth.     No current facility-administered medications for this encounter.     ALLERGIES: Review of patient's allergies indicates no known allergies.   LABORATORY DATA:  Lab Results  Component Value Date   WBC 7.8 08/14/2014   HGB 13.8 08/14/2014   HCT 41.8 08/14/2014   MCV 93.0 08/14/2014   PLT 305 08/14/2014   Lab Results  Component Value Date   NA 142 08/14/2014   K 3.8 08/14/2014   CO2 28 08/14/2014   Lab Results  Component Value Date   ALT 10 08/14/2014   AST 14 08/14/2014   ALKPHOS 95 08/14/2014   BILITOT 0.46 08/14/2014     NARRATIVE: Brittney Horton was seen today for weekly treatment management. The chart was checked and the patient's films were reviewed.  Weekly rad txs left breast 7 completed , very mild pink on breast, using radiaplex bid, no c/o    PHYSICAL EXAMINATION: weight is 139 lb (63.05 kg). Her oral temperature is 98 F (36.7 C). Her blood pressure is 122/78 and her pulse is 73. Her respiration is 20.        ASSESSMENT: The patient is doing satisfactorily with  treatment.  PLAN: We will continue with the patient's radiation treatment as planned.

## 2014-10-18 NOTE — Progress Notes (Signed)
Met with pt after xrt during wkly check. Pt relate she is doing well. Denies needs. Encourage pt to call with questions or concerns. Received verbal understanding.

## 2014-10-18 NOTE — Progress Notes (Signed)
Weekly rad txs left breast 7 completed , very milk pink on breast, using radiaplex bid, no c/o  2:08 PM

## 2014-10-21 ENCOUNTER — Ambulatory Visit
Admission: RE | Admit: 2014-10-21 | Discharge: 2014-10-21 | Disposition: A | Discharge status: Home / Self Care | Payer: BLUE CROSS/BLUE SHIELD | Source: Ambulatory Visit | Attending: Radiation Oncology | Admitting: Radiation Oncology

## 2014-10-21 ENCOUNTER — Ambulatory Visit (HOSPITAL_BASED_OUTPATIENT_CLINIC_OR_DEPARTMENT_OTHER): Payer: BLUE CROSS/BLUE SHIELD | Admitting: Hematology and Oncology

## 2014-10-21 VITALS — BP 115/68 | HR 67 | Temp 97.8°F | Resp 18 | Ht 64.0 in | Wt 141.1 lb

## 2014-10-21 DIAGNOSIS — D0512 Intraductal carcinoma in situ of left breast: Secondary | ICD-10-CM

## 2014-10-21 DIAGNOSIS — Z510 Encounter for antineoplastic radiation therapy: Secondary | ICD-10-CM | POA: Diagnosis not present

## 2014-10-21 DIAGNOSIS — C50412 Malignant neoplasm of upper-outer quadrant of left female breast: Secondary | ICD-10-CM

## 2014-10-21 NOTE — Assessment & Plan Note (Signed)
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 Ongoing XRT 2. Surveillance with Dr.Moody and Dr.Hoxworth  Will follow her on as needed basis.

## 2014-10-21 NOTE — Progress Notes (Signed)
Patient Care Team: Ellamae Sia, MD as PCP - General (Internal Medicine) Excell Seltzer, MD as Consulting Physician (General Surgery) Nicholas Lose, MD as Consulting Physician (Hematology and Oncology) Kyung Rudd, MD as Consulting Physician (Radiation Oncology) Holley Bouche, NP as Nurse Practitioner (Nurse Practitioner) Rockwell Germany, RN as Registered Nurse Mauro Kaufmann, RN as Registered Nurse  DIAGNOSIS: Breast cancer of upper-outer quadrant of left female breast   Staging form: Breast, AJCC 7th Edition     Clinical: Stage 0 (Tis (DCIS), N0, M0) - Unsigned       Staging comments: Staged at breast conference on 1.13.16      Pathologic stage from 09/09/2014: Stage 0 (Tis (DCIS), N0, cM0) - Signed by Seward Grater, MD on 09/24/2014       Staging comments: Staged on final lumpectomy specimen by Dr. Lyndon Code    SUMMARY OF ONCOLOGIC HISTORY:   Breast cancer of upper-outer quadrant of left female breast   07/16/2014 Breast MRI Left breast: 1.9 x 1.6 x 1.2 cm irregular area of low-grade non-mass enhancement, multiple small bilateral cysts   08/01/2014 Initial Diagnosis Left breast lower outer quadrant biopsy: DCIS with necrosis and calcifications grade 3 ER 0% PR 0%   09/06/2014 Surgery Left Lumpectomy: High grade DCIS 1.1 cm, LCIS, ER 0%, PR 0%, 0/1 LN    10/10/2014 -  Radiation Therapy Adjuvant radiation therapy    CHIEF COMPLIANT: F/U after surgery  INTERVAL HISTORY: Brittney Horton is a 49 yr old with H/O DCIS S/P lumpectomy and is now on adjuvant XRt and tolerating it well. Denies any new concerns or complaints. She is here to discuss the surgical pathology.  REVIEW OF SYSTEMS:   Constitutional: Denies fevers, chills or abnormal weight loss Eyes: Denies blurriness of vision Ears, nose, mouth, throat, and face: Denies mucositis or sore throat Respiratory: Denies cough, dyspnea or wheezes Cardiovascular: Denies palpitation, chest discomfort or lower extremity  swelling Gastrointestinal:  Denies nausea, heartburn or change in bowel habits Skin: Denies abnormal skin rashes Lymphatics: Denies new lymphadenopathy or easy bruising Neurological:Denies numbness, tingling or new weaknesses Behavioral/Psych: Mood is stable, no new changes  Breast:  denies any pain or lumps or nodules in either breasts All other systems were reviewed with the patient and are negative.  I have reviewed the past medical history, past surgical history, social history and family history with the patient and they are unchanged from previous note.  ALLERGIES:  has No Known Allergies.  MEDICATIONS:  Current Outpatient Prescriptions  Medication Sig Dispense Refill  . amLODipine (NORVASC) 5 MG tablet Take 5 mg by mouth daily.   2  . hyaluronate sodium (RADIAPLEXRX) GEL Apply 1 application topically 2 (two) times daily.    . metoprolol succinate (TOPROL-XL) 50 MG 24 hr tablet Take 50 mg by mouth daily.   2  . traMADol (ULTRAM) 50 MG tablet Take by mouth.    Marland Kitchen HYDROcodone-acetaminophen (NORCO/VICODIN) 5-325 MG per tablet Take 1-2 tablets by mouth every 4 (four) hours as needed for moderate pain or severe pain. (Patient not taking: Reported on 09/23/2014) 30 tablet 0  . SUMAtriptan (IMITREX) 50 MG tablet Take 50 mg by mouth as needed. For migraines  5   No current facility-administered medications for this visit.    PHYSICAL EXAMINATION: ECOG PERFORMANCE STATUS: 0 - Asymptomatic  Filed Vitals:   10/21/14 1359  BP: 115/68  Pulse: 67  Temp: 97.8 F (36.6 C)  Resp: 18   Filed Weights   10/21/14  1359  Weight: 141 lb 1.6 oz (64.003 kg)    GENERAL:alert, no distress and comfortable SKIN: skin color, texture, turgor are normal, no rashes or significant lesions EYES: normal, Conjunctiva are pink and non-injected, sclera clear OROPHARYNX:no exudate, no erythema and lips, buccal mucosa, and tongue normal  NECK: supple, thyroid normal size, non-tender, without  nodularity LYMPH:  no palpable lymphadenopathy in the cervical, axillary or inguinal LUNGS: clear to auscultation and percussion with normal breathing effort HEART: regular rate & rhythm and no murmurs and no lower extremity edema ABDOMEN:abdomen soft, non-tender and normal bowel sounds Musculoskeletal:no cyanosis of digits and no clubbing  NEURO: alert & oriented x 3 with fluent speech, no focal motor/sensory deficits BREAST: No palpable masses or nodules in either right or left breasts. No palpable axillary supraclavicular or infraclavicular adenopathy no breast tenderness or nipple discharge. (exam performed in the presence of a chaperone)  LABORATORY DATA:  I have reviewed the data as listed   Chemistry      Component Value Date/Time   NA 142 08/14/2014 0803   K 3.8 08/14/2014 0803   CO2 28 08/14/2014 0803   BUN 12.9 08/14/2014 0803   CREATININE 0.7 08/14/2014 0803      Component Value Date/Time   CALCIUM 8.7 08/14/2014 0803   ALKPHOS 95 08/14/2014 0803   AST 14 08/14/2014 0803   ALT 10 08/14/2014 0803   BILITOT 0.46 08/14/2014 0803       Lab Results  Component Value Date   WBC 7.8 08/14/2014   HGB 13.8 08/14/2014   HCT 41.8 08/14/2014   MCV 93.0 08/14/2014   PLT 305 08/14/2014   NEUTROABS 4.3 08/14/2014    ASSESSMENT & PLAN:  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 Ongoing XRT 2. Surveillance with Dr.Moody and Dr.Hoxworth  Will follow her on as needed basis.   No orders of the defined types were placed in this encounter.   The patient has a good understanding of the overall plan. she agrees with it. She will call with any problems that may develop before her next visit here.   Rulon Eisenmenger, MD

## 2014-10-22 ENCOUNTER — Ambulatory Visit: Admission: RE | Admit: 2014-10-22 | Payer: BLUE CROSS/BLUE SHIELD | Source: Ambulatory Visit

## 2014-10-23 ENCOUNTER — Ambulatory Visit
Admission: RE | Admit: 2014-10-23 | Discharge: 2014-10-23 | Disposition: A | Discharge status: Home / Self Care | Payer: BLUE CROSS/BLUE SHIELD | Source: Ambulatory Visit | Attending: Radiation Oncology | Admitting: Radiation Oncology

## 2014-10-23 DIAGNOSIS — Z510 Encounter for antineoplastic radiation therapy: Secondary | ICD-10-CM | POA: Diagnosis not present

## 2014-10-24 ENCOUNTER — Ambulatory Visit
Admission: RE | Admit: 2014-10-24 | Discharge: 2014-10-24 | Disposition: A | Discharge status: Home / Self Care | Payer: BLUE CROSS/BLUE SHIELD | Source: Ambulatory Visit | Attending: Radiation Oncology | Admitting: Radiation Oncology

## 2014-10-24 DIAGNOSIS — Z510 Encounter for antineoplastic radiation therapy: Secondary | ICD-10-CM | POA: Diagnosis not present

## 2014-10-25 ENCOUNTER — Ambulatory Visit
Admission: RE | Admit: 2014-10-25 | Discharge: 2014-10-25 | Disposition: A | Discharge status: Home / Self Care | Payer: BLUE CROSS/BLUE SHIELD | Source: Ambulatory Visit | Attending: Radiation Oncology | Admitting: Radiation Oncology

## 2014-10-25 DIAGNOSIS — Z510 Encounter for antineoplastic radiation therapy: Secondary | ICD-10-CM | POA: Diagnosis not present

## 2014-10-25 NOTE — Progress Notes (Signed)
Patient wasn't seen by MD or nursing, RT therapist forgot to send her around per Amy,RT, "she's good in her blockl till Wednesday" and will be sen by Dr. Pablo Ledger 2:34 PM

## 2014-10-28 ENCOUNTER — Ambulatory Visit: Admission: RE | Admit: 2014-10-28 | Payer: BLUE CROSS/BLUE SHIELD | Source: Ambulatory Visit

## 2014-10-28 ENCOUNTER — Ambulatory Visit
Admission: RE | Admit: 2014-10-28 | Discharge: 2014-10-28 | Disposition: A | Discharge status: Home / Self Care | Payer: BLUE CROSS/BLUE SHIELD | Source: Ambulatory Visit | Attending: Radiation Oncology | Admitting: Radiation Oncology

## 2014-10-28 DIAGNOSIS — Z510 Encounter for antineoplastic radiation therapy: Secondary | ICD-10-CM | POA: Diagnosis not present

## 2014-10-29 ENCOUNTER — Ambulatory Visit
Admission: RE | Admit: 2014-10-29 | Discharge: 2014-10-29 | Disposition: A | Discharge status: Home / Self Care | Payer: BLUE CROSS/BLUE SHIELD | Source: Ambulatory Visit | Attending: Radiation Oncology | Admitting: Radiation Oncology

## 2014-10-29 DIAGNOSIS — Z510 Encounter for antineoplastic radiation therapy: Secondary | ICD-10-CM | POA: Diagnosis not present

## 2014-10-30 ENCOUNTER — Encounter: Payer: Self-pay | Admitting: Radiation Oncology

## 2014-10-30 ENCOUNTER — Ambulatory Visit
Admission: RE | Admit: 2014-10-30 | Discharge: 2014-10-30 | Disposition: A | Discharge status: Home / Self Care | Payer: BLUE CROSS/BLUE SHIELD | Source: Ambulatory Visit | Attending: Radiation Oncology | Admitting: Radiation Oncology

## 2014-10-30 VITALS — BP 131/98 | HR 82 | Temp 98.0°F | Resp 20 | Wt 139.6 lb

## 2014-10-30 DIAGNOSIS — Z510 Encounter for antineoplastic radiation therapy: Secondary | ICD-10-CM | POA: Diagnosis not present

## 2014-10-30 DIAGNOSIS — C50412 Malignant neoplasm of upper-outer quadrant of left female breast: Secondary | ICD-10-CM

## 2014-10-30 NOTE — Progress Notes (Signed)
Weekly rad txs left breast  14/33 txs completed, mild erythema, skin intact, radiaplex bid, tenderness  Stated, appetite good, works 3rd shift, tired  2:45 PM

## 2014-10-30 NOTE — Progress Notes (Signed)
Weekly Management Note Current Dose: 25.2  Gy  Projected Dose: 60.4  Gy   Narrative:  The patient presents for routine under treatment assessment.  CBCT/MVCT images/Port film x-rays were reviewed.  The chart was checked. Doing well. Mild redness and fatigue. Working.   Physical Findings: Weight: 139 lb 9.6 oz (63.322 kg). Mild skin erythema.   Impression:  The patient is tolerating radiation.  Plan:  Continue treatment as planned. Continue radiaplex.

## 2014-10-31 ENCOUNTER — Ambulatory Visit
Admission: RE | Admit: 2014-10-31 | Discharge: 2014-10-31 | Disposition: A | Discharge status: Home / Self Care | Payer: BLUE CROSS/BLUE SHIELD | Source: Ambulatory Visit | Attending: Radiation Oncology | Admitting: Radiation Oncology

## 2014-10-31 DIAGNOSIS — Z510 Encounter for antineoplastic radiation therapy: Secondary | ICD-10-CM | POA: Diagnosis not present

## 2014-11-01 ENCOUNTER — Ambulatory Visit
Admission: RE | Admit: 2014-11-01 | Discharge: 2014-11-01 | Disposition: A | Discharge status: Home / Self Care | Payer: BLUE CROSS/BLUE SHIELD | Source: Ambulatory Visit | Attending: Radiation Oncology | Admitting: Radiation Oncology

## 2014-11-01 DIAGNOSIS — Z510 Encounter for antineoplastic radiation therapy: Secondary | ICD-10-CM | POA: Diagnosis not present

## 2014-11-04 ENCOUNTER — Ambulatory Visit
Admission: RE | Admit: 2014-11-04 | Discharge: 2014-11-04 | Disposition: A | Discharge status: Home / Self Care | Payer: BLUE CROSS/BLUE SHIELD | Source: Ambulatory Visit | Attending: Radiation Oncology | Admitting: Radiation Oncology

## 2014-11-04 ENCOUNTER — Encounter: Payer: Self-pay | Admitting: Radiation Oncology

## 2014-11-04 ENCOUNTER — Encounter: Payer: Self-pay | Admitting: Hematology and Oncology

## 2014-11-04 DIAGNOSIS — Z510 Encounter for antineoplastic radiation therapy: Secondary | ICD-10-CM | POA: Diagnosis not present

## 2014-11-05 ENCOUNTER — Ambulatory Visit
Admission: RE | Admit: 2014-11-05 | Discharge: 2014-11-05 | Disposition: A | Discharge status: Home / Self Care | Payer: BLUE CROSS/BLUE SHIELD | Source: Ambulatory Visit | Attending: Radiation Oncology | Admitting: Radiation Oncology

## 2014-11-05 DIAGNOSIS — Z510 Encounter for antineoplastic radiation therapy: Secondary | ICD-10-CM | POA: Diagnosis not present

## 2014-11-06 ENCOUNTER — Ambulatory Visit
Admission: RE | Admit: 2014-11-06 | Discharge: 2014-11-06 | Disposition: A | Discharge status: Home / Self Care | Payer: BLUE CROSS/BLUE SHIELD | Source: Ambulatory Visit | Attending: Radiation Oncology | Admitting: Radiation Oncology

## 2014-11-06 ENCOUNTER — Encounter: Payer: Self-pay | Admitting: Radiation Oncology

## 2014-11-06 VITALS — BP 116/76 | HR 79 | Temp 97.9°F | Resp 20 | Wt 141.6 lb

## 2014-11-06 DIAGNOSIS — Z510 Encounter for antineoplastic radiation therapy: Secondary | ICD-10-CM | POA: Diagnosis not present

## 2014-11-06 DIAGNOSIS — C50412 Malignant neoplasm of upper-outer quadrant of left female breast: Secondary | ICD-10-CM

## 2014-11-06 NOTE — Progress Notes (Signed)
   Department of Radiation Oncology  Phone:  [phone] Fax:        [phone]  Weekly Treatment Note    Name: Brittney Horton Date: 11/06/2014 MRN: [id number] DOB: 1965-10-11   Current fraction: 19   MEDICATIONS: Current Outpatient Prescriptions  Medication Sig Dispense Refill  . amLODipine (NORVASC) 5 MG tablet Take 5 mg by mouth daily.   2  . hyaluronate sodium (RADIAPLEXRX) GEL Apply 1 application topically 2 (two) times daily.    . metoprolol succinate (TOPROL-XL) 50 MG 24 hr tablet Take 50 mg by mouth daily.   2  . SUMAtriptan (IMITREX) 50 MG tablet Take 50 mg by mouth as needed. For migraines  5  . traMADol (ULTRAM) 50 MG tablet Take by mouth.     No current facility-administered medications for this encounter.     ALLERGIES: Review of patient's allergies indicates no known allergies.   LABORATORY DATA:  Lab Results  Component Value Date   WBC 7.8 08/14/2014   HGB 13.8 08/14/2014   HCT 41.8 08/14/2014   MCV 93.0 08/14/2014   PLT 305 08/14/2014   Lab Results  Component Value Date   NA 142 08/14/2014   K 3.8 08/14/2014   CO2 28 08/14/2014   Lab Results  Component Value Date   ALT 10 08/14/2014   AST 14 08/14/2014   ALKPHOS 95 08/14/2014   BILITOT 0.46 08/14/2014     NARRATIVE: Brittney Horton was seen today for weekly treatment management. The chart was checked and the patient's films were reviewed.  Weekly rad txs left breast, erythema , skin intact, using radiaplex bid, starting to itch some stated, appetite good, energy level good also 4:57 PM   PHYSICAL EXAMINATION: weight is 141 lb 9.6 oz (64.229 kg). Her oral temperature is 97.9 F (36.6 C). Her blood pressure is 116/76 and her pulse is 79. Her respiration is 20.      the patient's skin looks quite good at this point. Some hyperpigmentation present.  ASSESSMENT: The patient is doing satisfactorily with treatment.  PLAN: We will continue with the patient's radiation treatment as planned.

## 2014-11-06 NOTE — Progress Notes (Signed)
Weekly rad txs left breast, erythema , skin intact, using radiaplex bid, starting to itch some stated, appetite good, energy level good also 2:28 PM

## 2014-11-07 ENCOUNTER — Ambulatory Visit
Admission: RE | Admit: 2014-11-07 | Discharge: 2014-11-07 | Disposition: A | Discharge status: Home / Self Care | Payer: BLUE CROSS/BLUE SHIELD | Source: Ambulatory Visit | Attending: Radiation Oncology | Admitting: Radiation Oncology

## 2014-11-07 DIAGNOSIS — Z510 Encounter for antineoplastic radiation therapy: Secondary | ICD-10-CM | POA: Diagnosis not present

## 2014-11-08 ENCOUNTER — Ambulatory Visit
Admission: RE | Admit: 2014-11-08 | Discharge: 2014-11-08 | Disposition: A | Discharge status: Home / Self Care | Payer: BLUE CROSS/BLUE SHIELD | Source: Ambulatory Visit | Attending: Radiation Oncology | Admitting: Radiation Oncology

## 2014-11-08 DIAGNOSIS — Z510 Encounter for antineoplastic radiation therapy: Secondary | ICD-10-CM | POA: Diagnosis not present

## 2014-11-11 ENCOUNTER — Encounter: Payer: Self-pay | Admitting: Radiation Oncology

## 2014-11-11 ENCOUNTER — Telehealth: Payer: Self-pay | Admitting: *Deleted

## 2014-11-11 ENCOUNTER — Ambulatory Visit
Admission: RE | Admit: 2014-11-11 | Discharge: 2014-11-11 | Disposition: A | Discharge status: Home / Self Care | Payer: BLUE CROSS/BLUE SHIELD | Source: Ambulatory Visit | Attending: Radiation Oncology | Admitting: Radiation Oncology

## 2014-11-11 VITALS — BP 152/94 | HR 68 | Temp 97.9°F | Resp 20 | Wt 139.5 lb

## 2014-11-11 DIAGNOSIS — Z510 Encounter for antineoplastic radiation therapy: Secondary | ICD-10-CM | POA: Diagnosis not present

## 2014-11-11 DIAGNOSIS — C50412 Malignant neoplasm of upper-outer quadrant of left female breast: Secondary | ICD-10-CM

## 2014-11-11 MED ORDER — BIAFINE EX EMUL
CUTANEOUS | Status: DC | PRN
  Administered 2014-11-11: 15:00:00 via TOPICAL

## 2014-11-11 NOTE — Telephone Encounter (Signed)
Returned call to patient's voice message, she can be sen after tretament today to check breast 10:08 AM

## 2014-11-11 NOTE — Progress Notes (Signed)
Weekly rad txs, left breast , 22/33 completed, bright erythema start of rash top of breast,c/o burning, itching, and feels like sandpapaer inside breast , nipple discarge this am when changint her top, pinkish color stated, non at present, dryness around nipple area BP 152/94 mmHg  Pulse 68  Temp(Src) 97.9 F (36.6 C) (Oral)  Resp 20  Wt 139 lb 8 oz (63.277 kg)  Wt Readings from Last 3 Encounters:  11/11/14 139 lb 8 oz (63.277 kg)  10/21/14 141 lb 1.6 oz (64.003 kg)  09/23/14 141 lb 11.2 oz (64.275 kg)

## 2014-11-11 NOTE — Progress Notes (Signed)
   Department of Radiation Oncology  Phone:  [phone] Fax:        [phone]  Weekly Treatment Note    Name: Brittney Horton Date: 11/11/2014 MRN: [id number] DOB: [birth year]   Current dose: 39.6 Gy  Current fraction: 22   MEDICATIONS: Current Outpatient Prescriptions  Medication Sig Dispense Refill  . amLODipine (NORVASC) 5 MG tablet Take 5 mg by mouth daily.   2  . hyaluronate sodium (RADIAPLEXRX) GEL Apply 1 application topically 2 (two) times daily.    Marland Kitchen HYDROcodone-acetaminophen (NORCO/VICODIN) 5-325 MG per tablet Take 1 tablet by mouth every 6 (six) hours as needed for moderate pain.    . metoprolol succinate (TOPROL-XL) 50 MG 24 hr tablet Take 50 mg by mouth daily.   2  . SUMAtriptan (IMITREX) 50 MG tablet Take 50 mg by mouth as needed. For migraines  5  . traMADol (ULTRAM) 50 MG tablet Take by mouth.     Current Facility-Administered Medications  Medication Dose Route Frequency Provider Last Rate Last Dose  . topical emolient (BIAFINE) emulsion   Topical PRN Kyung Rudd, MD         ALLERGIES: Review of patient's allergies indicates no known allergies.   LABORATORY DATA:  Lab Results  Component Value Date   WBC 7.8 08/14/2014   HGB 13.8 08/14/2014   HCT 41.8 08/14/2014   MCV 93.0 08/14/2014   PLT 305 08/14/2014   Lab Results  Component Value Date   NA 142 08/14/2014   K 3.8 08/14/2014   CO2 28 08/14/2014   Lab Results  Component Value Date   ALT 10 08/14/2014   AST 14 08/14/2014   ALKPHOS 95 08/14/2014   BILITOT 0.46 08/14/2014     NARRATIVE: Brittney Horton was seen today for weekly treatment management. The chart was checked and the patient's films were reviewed.  The patient states that she has begun experiencing some occasional sharp pain in the breast. This feeling has been brief in nature. This has been combined with some increased irritation of the skin in the treatment area. She noticed a small amount of discharge from the nipple area on  the affected side which we are treating. No blood, no sign of infection or continuing discharge.  PHYSICAL EXAMINATION: weight is 139 lb 8 oz (63.277 kg). Her oral temperature is 97.9 F (36.6 C). Her blood pressure is 152/94 and her pulse is 68. Her respiration is 20.      the patient's skin shows diffuse erythema with some diffuse dryness. No moist desquamation. Some swelling in the breast is present.  ASSESSMENT: The patient is doing satisfactorily with treatment.   I believe that she is experiencing a combination of shooting pains as well as some increased skin irritation. We have given her Biafine to use on the skin and I reassured her regarding shooting pains which will resolve on their own within the breast region. She is to let us know if she experiences any significant further changes.  PLAN: We will continue with the patient's radiation treatment as planned.

## 2014-11-12 ENCOUNTER — Ambulatory Visit: Payer: BLUE CROSS/BLUE SHIELD

## 2014-11-13 ENCOUNTER — Ambulatory Visit: Payer: BLUE CROSS/BLUE SHIELD

## 2014-11-14 ENCOUNTER — Ambulatory Visit
Admission: RE | Admit: 2014-11-14 | Discharge: 2014-11-14 | Disposition: A | Discharge status: Home / Self Care | Payer: BLUE CROSS/BLUE SHIELD | Source: Ambulatory Visit | Attending: Radiation Oncology | Admitting: Radiation Oncology

## 2014-11-14 DIAGNOSIS — Z510 Encounter for antineoplastic radiation therapy: Secondary | ICD-10-CM | POA: Diagnosis not present

## 2014-11-15 ENCOUNTER — Ambulatory Visit
Admission: RE | Admit: 2014-11-15 | Discharge: 2014-11-15 | Disposition: A | Discharge status: Home / Self Care | Payer: BLUE CROSS/BLUE SHIELD | Source: Ambulatory Visit | Attending: Radiation Oncology | Admitting: Radiation Oncology

## 2014-11-15 DIAGNOSIS — Z510 Encounter for antineoplastic radiation therapy: Secondary | ICD-10-CM | POA: Diagnosis not present

## 2014-11-18 ENCOUNTER — Ambulatory Visit
Admission: RE | Admit: 2014-11-18 | Discharge: 2014-11-18 | Disposition: A | Discharge status: Home / Self Care | Payer: BLUE CROSS/BLUE SHIELD | Source: Ambulatory Visit | Attending: Radiation Oncology | Admitting: Radiation Oncology

## 2014-11-18 DIAGNOSIS — Z510 Encounter for antineoplastic radiation therapy: Secondary | ICD-10-CM | POA: Diagnosis not present

## 2014-11-19 ENCOUNTER — Ambulatory Visit: Payer: BLUE CROSS/BLUE SHIELD

## 2014-11-19 ENCOUNTER — Ambulatory Visit
Admission: RE | Admit: 2014-11-19 | Discharge: 2014-11-19 | Disposition: A | Discharge status: Home / Self Care | Payer: BLUE CROSS/BLUE SHIELD | Source: Ambulatory Visit | Attending: Radiation Oncology | Admitting: Radiation Oncology

## 2014-11-19 DIAGNOSIS — Z510 Encounter for antineoplastic radiation therapy: Secondary | ICD-10-CM | POA: Diagnosis not present

## 2014-11-19 NOTE — Discharge Summary (Signed)
PATIENT NAME:  Brittney Horton, Brittney Horton MR#:  [id number] DATE OF BIRTH:  1965/08/11  DATE OF ADMISSION:  02/22/2012 DATE OF DISCHARGE:  02/23/2012  ADMITTING PHYSICIAN: Dr. Bettey Costa.   DISCHARGING PHYSICIAN: Dr. Gladstone Lighter.   PRIMARY CARE PHYSICIAN:  Dr. Kingsley Spittle, [place].   DISCHARGE DIAGNOSES:  1. Malignant hypertension.  2. Right-sided arm pain secondary to hypertension.  3. Migraine headache.  4. Hypertriglyceridemia.   DISCHARGE HOME MEDICATIONS:  1. Lisinopril 10 mg p.o. daily.  2. Imitrex 15 milligrams every six hours p.r.n. for migraine.   DISCHARGE DIET: Low sodium diet, low fat, low cholesterol diet.   DISCHARGE ACTIVITY: As tolerated.    FOLLOWUP INSTRUCTIONS: PCP follow-up for hypertension in 1 to 2 weeks.   LABS AND IMAGING STUDIES: WBC 6.9, hemoglobin 14.3, hematocrit 41.1, platelet count 260. Sodium 141, potassium 3.9, chloride 106, bicarbonate 28, BUN 16, creatinine 0.64, glucose 110, calcium 8.5. LDL 84, HDL 39, total cholesterol 182, triglycerides 296. Troponins x3 negative. Chest x-ray showing no acute cardiopulmonary disease and Myoview showing normal treadmill EKG without evidence of ischemia or arrhythmia, average exercise tolerance for age, normal LV systolic function with ejection fraction 62%. Normal myocardial perfusion without evidence of myocardial ischemia.   BRIEF HOSPITAL COURSE: Ms. Wise is a 49 year old Caucasian female with past medical history significant for hypertension and has been off of medications recently, who comes to the ER complaining of right arm pain and was found to have a blood pressure of 200/99.  1. Malignant hypertension, probably triggered by recent stress and has been off of blood pressure medications. Blood pressure was controlled and she was admitted to the floor and her pressure has not been high since admission. She was restarted back on lisinopril that she was taking in the past at lower dose  and she said she will follow-up with her PCP after discharge in the next 1 to 2 weeks. Her arm pain was probably related to elevated blood pressure causing neck pain and radiating down her right arm. She did not have any neurological deficits and her arm pain was completely resolved as soon as her blood pressure improved.  2. Migraine headache. Known history of migraine. She was taking Imitrex at home too. She received a couple of doses of Imitrex while in the hospital secondary to [facility] and also the hypertension. Headache improved prior to discharge. The patient did have a stress test with strong family history of early cardiac disease with arm pain, which was found to be negative, so she was discharged home in stable condition.   DISCHARGE DISPOSITION: Home.   TIME SPENT ON DISCHARGE: 40 minutes.   ____________________________ Gladstone Lighter, MD rk:ap D: 02/24/2012 14:21:07 ET T: 02/25/2012 11:13:04 ET JOB#: [id number]  cc: Gladstone Lighter, MD, <Dictator> [place]ormick Gladstone Lighter MD ELECTRONICALLY SIGNED 02/28/2012 15:13

## 2014-11-19 NOTE — H&P (Signed)
PATIENT NAME:  Brittney Horton, Brittney Horton MR#:  [id number] DATE OF BIRTH:  [birth year]  DATE OF ADMISSION:  02/22/2012  PRIMARY CARE PHYSICIAN: Kingsley Spittle, MD - [place]   CHIEF COMPLAINT: Right arm pain and malignant hypertension.   HISTORY OF PRESENT ILLNESS: This is a 49 year old female with a history of hypertension who stopped taking her blood pressure medications several months ago because she thought she did not need it anymore who presented to urgent care with right arm pain. The patient says that at about 1:30 this morning she woke up with a heavy pain in her right arm, also right under her armpit. No other symptoms. It lasted a few minutes and then resolved. She went to work, however, at work the right arm pain worsened and she also felt kind of spacey for a few minutes. She called her PCP, however, there were no appointments available so she was brought into urgent care for further evaluation. In urgent care, her blood pressure on arrival was 196/110. They gave her nitroglycerin 1 inch paste. It was still 200/99 and they gave her 125 mg of metoprolol and so the hospitalist was called for direct admission due to malignant hypertension. The patient was initially admitted to the Critical Care Unit. At discharge from the urgent care, her blood pressure was 180/105. However, when she arrived here at the [facility] her blood pressure was 135/79. He is now 100/64. She has not received any medications other than what she received in the ER and the nitroglycerin paste has been removed. She no longer has any kind of right arm numbness.   REVIEW OF SYSTEMS: CONSTITUTIONAL: No fever, chills, headaches, vision changes, or blurred vision. EYES: No blurred or double vision, inflammation, or glaucoma. ENT: No ear pain, hearing loss, seasonal allergies, snoring, or redness of the oropharynx. RESPIRATORY: No cough, wheezing, hemoptysis, or chronic obstructive pulmonary disease.  CARDIOVASCULAR: She does not really per se have chest pain. She had some right arm pain. No palpitations, orthopnea, syncope, edema, or arrhythmia. GASTROINTESTINAL: No nausea, vomiting, diarrhea, abdominal pain, melena, or ulcers. GENITOURINARY: No dysuria or hematuria. ENDOCRINE: No polyuria or polydipsia. HEME/LYMPH: No anemia or easy bruising. SKIN: No rash or lesions. MUSCULOSKELETAL: No pain in shoulders, knees, or hips. No limited activity or redness. NEURO: No history of CVA, transient ischemic attack, or seizures. PSYCH: No history of anxiety or depression.    PAST MEDICAL HISTORY:  1. Hypertension.  2. Migraines.   MEDICATIONS: Imitrex p.r.n.   SOCIAL HISTORY: No tobacco. Occasional alcohol use.   FAMILY HISTORY: Positive for early coronary artery disease. Her mother had her first heart attack in her early 83s and passed away at age 61 from a heart attack. Positive cerebrovascular accident and diabetes in the family and hypertension.   PAST SURGICAL HISTORY: None.   PHYSICAL EXAMINATION:   VITALS: The patient is afebrile with a temperature of 97.8, pulse 64 to 68, respirations 14 to 18, blood pressure 100 to 135/64 to 79, and saturation 96% on room air.   GENERAL: The patient is alert and oriented, not in acute distress.   HEENT: Head is atraumatic. Pupils are round and reactive. Sclerae anicteric. Mucous membranes are moist. Oropharynx is clear.   NECK: Supple without jugular venous distention, carotid bruit, or enlarged thyroid.   CARDIOVASCULAR: Regular rate and rhythm. No murmurs, gallops, or rubs. PMI is not displaced.   LUNGS: Clear to auscultation bilaterally without crackles, rales, rhonchi, or wheezing. Normal to percussion.  ABDOMEN: Bowel sounds are positive. Nontender and nondistended. No hepatosplenomegaly.   EXTREMITIES: No clubbing, cyanosis or edema.   NEUROLOGIC: Cranial nerves II through XII are intact. There are no focal deficits.   SKIN: No rash or  lesions.   LABS/RADIOLOGIC STUDIES: Chest x-ray shows no acute cardiopulmonary disease.  White blood cells 6.9, hemoglobin 14, hematocrit 41.1, and platelets 260. Sodium 140, potassium 3.7, chloride 102, bicarbonate 31, BUN 12, creatinine 0.68, glucose 91, calcium 9.2, bilirubin 0.4, alkaline phosphatase 108, AST 20, ALT 14, total protein 7.4, and albumin 4.4.   Urine pregnancy is negative. Urinalysis shows no leukocyte esterase or nitrites. Urine toxicology is negative.   EKG at [facility] showed normal sinus rhythm, no ST elevation or depression.   ASSESSMENT AND PLAN: This is a 49 year old female who initially presented to an urgent care with malignant hypertension and right arm pain who subsequently was initially admitted for direct admission for malignant hypertension who subsequently came to the Critical Care Unit with a normal blood pressure. However, she will be admitted for observation for her right arm pain to rule out acute coronary syndrome.  1. Right arm pain. This could be related to her malignant hypertension as it has now resolved. However, she could have some underlying cardiac pathology as well given her family history of early coronary artery disease. Since the patient is here, we will go ahead and obtain a Myoview stress test for the a.m., cycle her cardiac enzymes, and continue telemetry monitoring. I will also start aspirin 81 mg daily. I will not use nitroglycerin as her blood pressure is low now. 2. Malignant hypertension. Her blood pressure is actually low now. Provide some gentle hydration and hold any blood pressure medications for now. The patient will likely need to have some blood pressure medications prior to discharge with close follow-up with her PCP.   CODE STATUS: The patient is FULL CODE status.  TIME SPENT: Approximately 45 minutes. ____________________________ Donell Beers. Benjie Karvonen, MD spm:slb D: 02/22/2012 16:27:39 ET    T: 02/22/2012 16:55:24 ET        JOB#: [id number] cc: Ellamae Sia, MD Donell Beers Shemekia Patane MD ELECTRONICALLY SIGNED 02/22/2012 19:26

## 2014-11-20 ENCOUNTER — Ambulatory Visit: Payer: BLUE CROSS/BLUE SHIELD

## 2014-11-20 ENCOUNTER — Ambulatory Visit
Admission: RE | Admit: 2014-11-20 | Discharge: 2014-11-20 | Disposition: A | Discharge status: Home / Self Care | Payer: BLUE CROSS/BLUE SHIELD | Source: Ambulatory Visit | Attending: Radiation Oncology | Admitting: Radiation Oncology

## 2014-11-20 DIAGNOSIS — Z510 Encounter for antineoplastic radiation therapy: Secondary | ICD-10-CM | POA: Diagnosis not present

## 2014-11-21 ENCOUNTER — Ambulatory Visit: Payer: BLUE CROSS/BLUE SHIELD

## 2014-11-21 ENCOUNTER — Ambulatory Visit
Admission: RE | Admit: 2014-11-21 | Discharge: 2014-11-21 | Disposition: A | Discharge status: Home / Self Care | Payer: BLUE CROSS/BLUE SHIELD | Source: Ambulatory Visit | Attending: Radiation Oncology | Admitting: Radiation Oncology

## 2014-11-21 DIAGNOSIS — Z510 Encounter for antineoplastic radiation therapy: Secondary | ICD-10-CM | POA: Diagnosis not present

## 2014-11-22 ENCOUNTER — Ambulatory Visit
Admission: RE | Admit: 2014-11-22 | Discharge: 2014-11-22 | Disposition: A | Discharge status: Home / Self Care | Payer: BLUE CROSS/BLUE SHIELD | Source: Ambulatory Visit | Attending: Radiation Oncology | Admitting: Radiation Oncology

## 2014-11-22 ENCOUNTER — Encounter: Payer: Self-pay | Admitting: Radiation Oncology

## 2014-11-22 ENCOUNTER — Ambulatory Visit: Payer: BLUE CROSS/BLUE SHIELD

## 2014-11-22 VITALS — BP 120/82 | HR 63 | Temp 98.1°F | Wt 140.3 lb

## 2014-11-22 DIAGNOSIS — Z510 Encounter for antineoplastic radiation therapy: Secondary | ICD-10-CM | POA: Diagnosis not present

## 2014-11-22 DIAGNOSIS — C50412 Malignant neoplasm of upper-outer quadrant of left female breast: Secondary | ICD-10-CM

## 2014-11-22 NOTE — Progress Notes (Signed)
Weekly assessment of radiation to left breast.Completed 29 of 33 treatments.Denies pain or fatigue.Moderate discoloration with small area of dry peeling.Continue application of radiaplex.

## 2014-11-22 NOTE — Progress Notes (Signed)
   Department of Radiation Oncology  Phone:  [phone] Fax:        [phone]  Weekly Treatment Note    Name: Brittney Horton Date: 11/22/2014 MRN: [id number] DOB: 1965/11/24   Current dose: 52.4 Gy  Current fraction: 19   MEDICATIONS: Current Outpatient Prescriptions  Medication Sig Dispense Refill  . amLODipine (NORVASC) 5 MG tablet Take 5 mg by mouth daily.   2  . hyaluronate sodium (RADIAPLEXRX) GEL Apply 1 application topically 2 (two) times daily.    . metoprolol succinate (TOPROL-XL) 50 MG 24 hr tablet Take 50 mg by mouth daily.   2  . SUMAtriptan (IMITREX) 50 MG tablet Take 50 mg by mouth as needed. For migraines  5  . HYDROcodone-acetaminophen (NORCO/VICODIN) 5-325 MG per tablet Take 1 tablet by mouth every 6 (six) hours as needed for moderate pain.    . traMADol (ULTRAM) 50 MG tablet Take by mouth.     No current facility-administered medications for this encounter.     ALLERGIES: Review of patient's allergies indicates no known allergies.   LABORATORY DATA:  Lab Results  Component Value Date   WBC 7.8 08/14/2014   HGB 13.8 08/14/2014   HCT 41.8 08/14/2014   MCV 93.0 08/14/2014   PLT 305 08/14/2014   Lab Results  Component Value Date   NA 142 08/14/2014   K 3.8 08/14/2014   CO2 28 08/14/2014   Lab Results  Component Value Date   ALT 10 08/14/2014   AST 14 08/14/2014   ALKPHOS 95 08/14/2014   BILITOT 0.46 08/14/2014     NARRATIVE: Brittney Horton was seen today for weekly treatment management. The chart was checked and the patient's films were reviewed.  Weekly assessment of radiation to left breast.Completed 29 of 33 treatments.Denies pain or fatigue.Moderate discoloration with small area of dry peeling.Continue application of radiaplex.  PHYSICAL EXAMINATION: weight is 140 lb 4.8 oz (63.64 kg). Her temperature is 98.1 F (36.7 C). Her blood pressure is 120/82 and her pulse is 63.      the patient's skin shows some diffuse erythema and  hyperpigmentation. Some dryness. Overall however it looks quite good for near completion.  ASSESSMENT: The patient is doing satisfactorily with treatment.  PLAN: We will continue with the patient's radiation treatment as planned.

## 2014-11-25 ENCOUNTER — Ambulatory Visit
Admission: RE | Admit: 2014-11-25 | Discharge: 2014-11-25 | Disposition: A | Discharge status: Home / Self Care | Payer: BLUE CROSS/BLUE SHIELD | Source: Ambulatory Visit | Attending: Radiation Oncology | Admitting: Radiation Oncology

## 2014-11-25 ENCOUNTER — Ambulatory Visit: Payer: BLUE CROSS/BLUE SHIELD | Attending: Radiation Oncology | Admitting: Radiation Oncology

## 2014-11-25 ENCOUNTER — Ambulatory Visit: Payer: BLUE CROSS/BLUE SHIELD

## 2014-11-25 DIAGNOSIS — Z510 Encounter for antineoplastic radiation therapy: Secondary | ICD-10-CM | POA: Diagnosis not present

## 2014-11-26 ENCOUNTER — Ambulatory Visit: Payer: BLUE CROSS/BLUE SHIELD

## 2014-11-26 ENCOUNTER — Ambulatory Visit
Admission: RE | Admit: 2014-11-26 | Discharge: 2014-11-26 | Disposition: A | Discharge status: Home / Self Care | Payer: BLUE CROSS/BLUE SHIELD | Source: Ambulatory Visit | Attending: Radiation Oncology | Admitting: Radiation Oncology

## 2014-11-26 DIAGNOSIS — Z510 Encounter for antineoplastic radiation therapy: Secondary | ICD-10-CM | POA: Diagnosis not present

## 2014-11-27 ENCOUNTER — Ambulatory Visit: Payer: BLUE CROSS/BLUE SHIELD

## 2014-11-27 ENCOUNTER — Ambulatory Visit
Admission: RE | Admit: 2014-11-27 | Discharge: 2014-11-27 | Disposition: A | Discharge status: Home / Self Care | Payer: BLUE CROSS/BLUE SHIELD | Source: Ambulatory Visit | Attending: Radiation Oncology | Admitting: Radiation Oncology

## 2014-11-27 DIAGNOSIS — Z510 Encounter for antineoplastic radiation therapy: Secondary | ICD-10-CM | POA: Diagnosis not present

## 2014-11-28 ENCOUNTER — Encounter: Payer: Self-pay | Admitting: Radiation Oncology

## 2014-11-28 ENCOUNTER — Ambulatory Visit: Payer: BLUE CROSS/BLUE SHIELD

## 2014-11-28 ENCOUNTER — Ambulatory Visit
Admission: RE | Admit: 2014-11-28 | Discharge: 2014-11-28 | Disposition: A | Discharge status: Home / Self Care | Payer: BLUE CROSS/BLUE SHIELD | Source: Ambulatory Visit | Attending: Radiation Oncology | Admitting: Radiation Oncology

## 2014-11-28 VITALS — BP 114/53 | HR 70 | Temp 98.4°F | Resp 12 | Wt 140.7 lb

## 2014-11-28 DIAGNOSIS — C50412 Malignant neoplasm of upper-outer quadrant of left female breast: Secondary | ICD-10-CM

## 2014-11-28 DIAGNOSIS — Z510 Encounter for antineoplastic radiation therapy: Secondary | ICD-10-CM | POA: Diagnosis not present

## 2014-11-28 NOTE — Progress Notes (Signed)
She is currently in no pain. Pt left breast- positive for Dryness, Hyperpigmentation, erythema, breast tenderness and dry desquamation.  Pt denies edema. Pt continues to apply Radiaplex as directed. BP 114/53 mmHg  Pulse 70  Temp(Src) 98.4 F (36.9 C) (Oral)  Resp 12  Wt 140 lb 11.2 oz (63.821 kg)  SpO2 99%  FINAL TREATMENT!

## 2014-11-28 NOTE — Progress Notes (Signed)
Weekly Management Note Current Dose:  60.4 Gy  Projected Dose: 60.4 Gy   Narrative:  The patient presents for routine under treatment assessment.  CBCT/MVCT images/Port film x-rays were reviewed.  The chart was checked. Doing well. Skin feels better.   Physical Findings: Weight: 140 lb 11.2 oz (63.821 kg). Pink skin. No moist desquamation.  Impression:  The patient is tolerating radiation.  Plan:  Continue treatment as planned. Follow up in 1 month. No appt with med onc due to no antiestrogen therapy. Will need f/u between surgery and rad onc. Discussed post RT skin care.   This document serves as a record of services personally performed by Thea Silversmith, MD. It was created on her behalf by Darcus Austin, a trained medical scribe. The creation of this record is based on the scribe's personal observations and the provider's statements to them. This document has been checked and approved by the attending provider.

## 2014-11-29 ENCOUNTER — Ambulatory Visit: Payer: BLUE CROSS/BLUE SHIELD

## 2014-12-02 ENCOUNTER — Telehealth: Payer: Self-pay | Admitting: *Deleted

## 2014-12-02 NOTE — Telephone Encounter (Signed)
Spoke to pt to assess needs after completion of xrt. Relate she was doing "really good". Denies needs or concerns. Encourage pt to call with questions. Received verbal understanding.

## 2014-12-03 ENCOUNTER — Ambulatory Visit: Payer: BLUE CROSS/BLUE SHIELD

## 2014-12-04 ENCOUNTER — Ambulatory Visit: Payer: BLUE CROSS/BLUE SHIELD

## 2014-12-09 ENCOUNTER — Telehealth: Payer: Self-pay | Admitting: *Deleted

## 2014-12-09 NOTE — Telephone Encounter (Signed)
CALLED PATIENT TO ASK ABOUT COMING IN [place] FOR FU WITH DR. MOODY, LVM FOR A RETURN CALL

## 2014-12-13 ENCOUNTER — Telehealth: Payer: Self-pay | Admitting: Adult Health

## 2014-12-13 NOTE — Telephone Encounter (Signed)
I attempted to reach Ms. Pe to coordinate her Survivorship Clinic visit with her follow-up visit with Dr. Valere Dross on 01/22/15.  I asked that she return my call at her earliest convenience to try to get her survivorship appointment scheduled.  I left my direct office number for her to return my call.   Mike Craze, NP [place] [phone]

## 2015-01-02 ENCOUNTER — Ambulatory Visit: Payer: Self-pay | Admitting: Radiation Oncology

## 2015-01-02 NOTE — Progress Notes (Signed)
  Radiation Oncology         [phone]) [phone] ________________________________  Name: Brittney Horton MRN: [id number]  Date: 11/04/2014  DOB: June 06, 1966  Complex simulation note  The patient has undergone complex simulation for her upcoming boost treatment for her diagnosis of breast cancer. The patient has initially been planned to receive 50.4 Gy. The patient will now receive a 10 Gy boost to the seroma cavity which has been contoured. This will be accomplished using an en face electron field. Based on the depth of the target area, 15 MeV electrons will be used. The patient's final total dose therefore will be 60.4 Gy. A special port plan is requested for the boost treatment.   _______________________________  Jodelle Gross, MD, PhD

## 2015-01-02 NOTE — Progress Notes (Signed)
  Radiation Oncology         (336) [phone] ________________________________  Name: Brittney Horton MRN: [id number]  Date: 11/28/2014  DOB: May 03, 1966  End of Treatment Note  Diagnosis:  Breast cancer of upper-outer quadrant of left female breast   Staging form: Breast, AJCC 7th Edition     Clinical: Stage 0 (Tis (DCIS), N0, M0) - Unsigned       Staging comments: Staged at breast conference on 1.13.16      Pathologic stage from 09/09/2014: Stage 0 (Tis (DCIS), N0, cM0) - Signed by Seward Grater, MD on 09/24/2014       Staging comments: Staged on final lumpectomy specimen by Dr. Lyndon Code        Indication for treatment::  curative       Radiation treatment dates:   10/10/14 - 11/28/14  Site/dose:   The patient received whole breast radiation treatment to the left breast to 50.5 Gy. She then received a boost for an additional 10 Gy to the seroma using 15 MeV electrons. The total dose was 60.4 Gy.  Narrative: The patient tolerated radiation treatment relatively well.   No unexpected skin toxicity.  Plan: The patient has completed radiation treatment. The patient will return to radiation oncology clinic for routine followup in one month. I advised the patient to call or return sooner if they have any questions or concerns related to their recovery or treatment. ________________________________  Jodelle Gross, M.D., Ph.D.

## 2015-01-14 ENCOUNTER — Encounter: Payer: Self-pay | Admitting: Radiation Oncology

## 2015-01-22 ENCOUNTER — Ambulatory Visit
Admission: RE | Admit: 2015-01-22 | Discharge: 2015-01-22 | Disposition: A | Discharge status: Home / Self Care | Payer: BLUE CROSS/BLUE SHIELD | Source: Ambulatory Visit | Attending: Radiation Oncology | Admitting: Radiation Oncology

## 2015-01-22 ENCOUNTER — Encounter: Payer: Self-pay | Admitting: Radiation Oncology

## 2015-01-22 VITALS — BP 126/79 | HR 79 | Temp 98.8°F | Resp 20 | Wt 142.1 lb

## 2015-01-22 DIAGNOSIS — C50412 Malignant neoplasm of upper-outer quadrant of left female breast: Secondary | ICD-10-CM

## 2015-01-22 HISTORY — DX: Personal history of irradiation: Z92.3

## 2015-01-22 NOTE — Progress Notes (Signed)
Follow up s/p left breast rad txs 10/10/14-11/28/14  Breast well healed, no c/o pain, appetite good, energy level good, no c/o 1:27 PM BP 126/79 mmHg  Pulse 79  Temp(Src) 98.8 F (37.1 C) (Oral)  Resp 20  Wt 142 lb 1.6 oz (64.456 kg)  Wt Readings from Last 3 Encounters:  01/22/15 142 lb 1.6 oz (64.456 kg)  11/28/14 140 lb 11.2 oz (63.821 kg)  11/22/14 140 lb 4.8 oz (63.64 kg)  Gaspar Garbe, RN II Rad/Onc

## 2015-01-22 NOTE — Progress Notes (Signed)
Radiation Oncology         [phone]) [phone] ________________________________  Name: Brittney Horton MRN: [id number]  Date: 01/22/2015  DOB: 06-19-1966  Follow-Up Visit Note  CC: Ellamae Sia, MD  Excell Seltzer, MD  Diagnosis:   Left-sided breast cancer  Interval Since Last Radiation:  Approximately one month. 11/28/14.  Narrative:  The patient returns today for s/p left breast rad txs. She has done well overall since she finished treatment. The patient's skin has healed significantly since she completed her course of radiation treatment. She has not begun anti-hormonal treatment. The pt's tumor was receptor negative. She has no complaints of pain. She reports good appetite and energy levels and no other complaints at this time. The pt is due for mammograms in December.  ALLERGIES:  has No Known Allergies.  Meds: Current Outpatient Prescriptions  Medication Sig Dispense Refill  . amLODipine (NORVASC) 5 MG tablet Take 5 mg by mouth daily.   2  . metoprolol succinate (TOPROL-XL) 50 MG 24 hr tablet Take 50 mg by mouth daily.   2  . SUMAtriptan (IMITREX) 50 MG tablet Take 50 mg by mouth as needed. For migraines  5  . hyaluronate sodium (RADIAPLEXRX) GEL Apply 1 application topically 2 (two) times daily.    Marland Kitchen HYDROcodone-acetaminophen (NORCO/VICODIN) 5-325 MG per tablet Take 1 tablet by mouth every 6 (six) hours as needed for moderate pain.    . traMADol (ULTRAM) 50 MG tablet Take by mouth.     No current facility-administered medications for this encounter.    Physical Findings: The patient is in no acute distress. Patient is alert and oriented.  weight is 142 lb 1.6 oz (64.456 kg). Her oral temperature is 98.8 F (37.1 C). Her blood pressure is 126/79 and her pulse is 79. Her respiration is 20. .   The skin in the treatment area has healed satisfactorily, no areas of concern/moist desquamation/poor healing.  Lab Findings: Lab Results  Component Value Date   WBC 7.8 08/14/2014   HGB 13.8 08/14/2014   HCT 41.8 08/14/2014   MCV 93.0 08/14/2014   PLT 305 08/14/2014     Radiographic Findings: No results found.  Impression:    The patient has done satisfactorily since finishing treatment. She has not begun anti-hormonal treatment.  Plan:  The patient will followup in our clinic in 7 months. Pt is due for a mammogram in December 2016.  This document serves as a record of services personally performed by Kyung Rudd, MD. It was created on his behalf by Darcus Austin, a trained medical scribe. The creation of this record is based on the scribe's personal observations and the provider's statements to them. This document has been checked and approved by the attending provider.     Jodelle Gross, M.D., Ph.D.

## 2015-01-31 ENCOUNTER — Telehealth: Payer: Self-pay | Admitting: *Deleted

## 2015-01-31 NOTE — Telephone Encounter (Signed)
Left vm for pt to return call to assess needs after xrt. Contact information given.

## 2015-03-17 ENCOUNTER — Telehealth: Payer: Self-pay | Admitting: Adult Health

## 2015-03-17 NOTE — Telephone Encounter (Signed)
I left a voicemail for Ms. Payes with her Survivorship Clinic appt to see Chestine Spore, NP.  We have scheduled her survivorship visit and will mail a copy of her calendar with her appt as well.  We look forward to participating in her care.   Mike Craze, NP [place] [phone]

## 2015-03-25 ENCOUNTER — Telehealth: Payer: Self-pay | Admitting: Adult Health

## 2015-03-25 NOTE — Telephone Encounter (Signed)
I called Brittney Horton to remind her of her appt scheduled for this week and she stated that she did not know about the appt.  I explained to her that now that she has completed treatment for breast cancer, she was eligible to see [place] in survivorship to help her better understand her follow-up plan, potential late/long-term side effects of treatment, and review her survivorship care plan with her.    We have now rescheduled her appt to next Thursday 04/03/15 at 2:30pm, due to her being unable to come to her appt this week.  I let her know that she will be seeing Chestine Spore, our breast survivorship NP.  I also gave her instructions on where to check in for this appt.  We look forward to participating in her care.   Mike Craze, NP [place] [phone]

## 2015-03-27 ENCOUNTER — Encounter: Payer: BLUE CROSS/BLUE SHIELD | Admitting: Nurse Practitioner

## 2015-04-01 ENCOUNTER — Telehealth: Payer: Self-pay | Admitting: Nurse Practitioner

## 2015-04-01 NOTE — Telephone Encounter (Signed)
Returned Advertising account executive. Patient confirmed appointment change from 09/01 to 09/08

## 2015-04-03 ENCOUNTER — Encounter: Payer: BLUE CROSS/BLUE SHIELD | Admitting: Nurse Practitioner

## 2015-04-10 ENCOUNTER — Encounter: Payer: Self-pay | Admitting: Nurse Practitioner

## 2015-04-10 ENCOUNTER — Ambulatory Visit (HOSPITAL_BASED_OUTPATIENT_CLINIC_OR_DEPARTMENT_OTHER): Payer: BLUE CROSS/BLUE SHIELD | Admitting: Nurse Practitioner

## 2015-04-10 VITALS — BP 124/76 | HR 79 | Temp 98.7°F | Resp 18 | Ht 64.0 in | Wt 141.5 lb

## 2015-04-10 DIAGNOSIS — C50412 Malignant neoplasm of upper-outer quadrant of left female breast: Secondary | ICD-10-CM

## 2015-04-10 DIAGNOSIS — Z853 Personal history of malignant neoplasm of breast: Secondary | ICD-10-CM

## 2015-04-10 NOTE — Progress Notes (Signed)
CLINIC:  Cancer Survivorship   REASON FOR VISIT:  Routine follow-up post-treatment for a recent history of breast cancer.  BRIEF ONCOLOGIC HISTORY:    Breast cancer of upper-outer quadrant of left female breast   07/16/2014 Breast MRI Left breast: 1.9 x 1.6 x 1.2 cm irregular area of low-grade non-mass enhancement, multiple small bilateral cysts. No findings suggestive of malignancy on right.   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   08/01/2014 Clinical Stage Stage 0: Tis N0   09/05/2014 Procedure Genetic Testing: [facility] [place]) panel reveals no clinically significant variant in ATM, BARD1, BRCA1, BRCA2, BRIP1, CDH1, CHEK2, MRE11A, MUTYH, NBN, NF1, PALB2, PTEN, RAD50, RAD51C, RAD51D, and TP53.   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 1 lymph node removed and negative for malignancy.     09/06/2014 Pathologic Stage Stage 0: pTis pN0   09/06/2014 Surgery Left Lumpectomy ([facility]): High grade DCIS 1.1 cm, LCIS, ER 0%, PR 0%, 0/1 LN    10/10/2014 - 11/28/2014 Radiation Therapy Adjuvant RT completed [facility]): Left breast 50.5 Gy. Left breast boost: 10 Gy. Total dose: 60.4 Gy.    INTERVAL HISTORY:  Brittney Horton presents to the Survivorship Clinic today for our initial meeting to review her survivorship care plan detailing her treatment course for breast cancer, as well as monitoring long-term side effects of that treatment, education regarding health maintenance, screening, and overall wellness and health promotion.     Overall, Brittney Horton reports feeling quite well since completing her radiation therapy approximately four months ago.  Her skin has returned to normal and she denies fatigue.  She has had no changes within either breast, and denies any shortness of breath, cough, pain, or change in weight.  She does report  some intermittent abdominal bloating and discomfort.  She states that she is not constipated, but does not have bowel movements daily.  She denies any nausea or vomiting. Brittney Horton states that she is overdue for her annual pelvic exam / Pap smear. She will be due for her mammograms in December and will follow up with Dr. Lisbeth Horton in January 2017.    REVIEW OF SYSTEMS:  General: Denies fever, chills, or night sweats. Cardiac: Denies palpitations, chest pain, and lower extremity edema.  Respiratory: Denies dyspnea on exertion.  GI: Denies abdominal pain or diarrhea.2 GU: Denies dysuria, hematuria, vaginal bleeding, vaginal discharge, or vaginal dryness.  Musculoskeletal: Denies joint or bone pain.  Neuro: Denies headache or recent falls. Denies peripheral neuropathy. Skin: Denies rash, pruritis, or open wounds.  Breast: Denies any new nodularity, masses, tenderness, nipple changes, or nipple discharge.  Psych: Denies depression, anxiety, insomnia, or memory loss.   A 14-point review of systems was completed and was negative, except as noted above.   ONCOLOGY TREATMENT TEAM:  1. Surgeon:  Dr. Excell Seltzer at [facility]  2. Medical Oncologist: Dr. Lindi Adie 3. Radiation Oncologist: Dr. Lisbeth Horton    PAST MEDICAL/SURGICAL HISTORY:  Past Medical History  Diagnosis Date  . Breast cancer 08/01/14 bx    left breast LOQ  . Hypertension   . Headache   . Anxiety   . S/P radiation therapy 10/10/14-11/28/14    left breast 60.4Gy   Past Surgical History  Procedure Laterality Date  . Cesarean section      x5  . Diagnostic laparoscopy      tubal preg  .  Breast lumpectomy with axillary lymph node biopsy Left 09/06/14    ductal ca in situ, high grade     ALLERGIES:  No Known Allergies   CURRENT MEDICATIONS:  Current Outpatient Prescriptions on File Prior to Visit  Medication Sig Dispense Refill  . amLODipine (NORVASC) 5 MG tablet Take 5 mg by mouth daily.   2  . metoprolol succinate  (TOPROL-XL) 50 MG 24 hr tablet Take 50 mg by mouth daily.   2  . SUMAtriptan (IMITREX) 50 MG tablet Take 50 mg by mouth as needed. For migraines  5   No current facility-administered medications on file prior to visit.     ONCOLOGIC FAMILY HISTORY:  Family History  Problem Relation Age of Onset  . Other Father     father died in his 80s and patient does not know any paternal family history  . Cancer Cousin     mat first cousin diagnosed with breast cancer at a young age (female)     GENETIC COUNSELING/TESTING: Yes, completed 09/05/14. [facility]Next ([place]) panel reveals no clinically significant variant in ATM, BARD1, BRCA1, BRCA2, BRIP1, CDH1, CHEK2, MRE11A, MUTYH, NBN, NF1, PALB2, PTEN, RAD50, RAD51C, RAD51D, and TP53.  SOCIAL HISTORY:  Brittney Horton is single and lives alone in [place], [place].  She has no children.   She denies any current or history of tobacco or illicit drug use.  She reports social alcohol use.   PHYSICAL EXAMINATION:  Vital Signs:   Filed Vitals:   04/10/15 1343  BP: 124/76  Pulse: 79  Temp: 98.7 F (37.1 C)  Resp: 18   ECOG Performance Status: 0 General: Well-nourished, well-appearing female in no acute distress.  She is unaccompanied in clinic today.   HEENT: Head is atraumatic and normocephalic.  Pupils equal and reactive to light and accomodation. Conjunctivae clear without exudate.  Sclerae anicteric. Oral mucosa is pink, moist, and intact without lesions.  Oropharynx is pink without lesions or erythema.  Lymph: No cervical, supraclavicular, infraclavicular, or axillary lymphadenopathy noted on palpation.  Cardiovascular: Regular rate and rhythm without murmurs, rubs, or gallops. Respiratory: Clear to auscultation bilaterally. Chest expansion symmetric without accessory muscle use on inspiration or expiration.  GI: Abdomen soft and round. No tenderness to palpation. Bowel sounds normoactive in 4 quadrants. No hepatosplenomegaly.   GU:  Deferred.  Musculoskeletal: Muscle strength 5/5 in all extremities.  Full ROM noted in all extremities.  Neuro: No focal deficits. Steady gait.  Psych: Mood and affect normal and appropriate for situation.  Extremities: No edema, cyanosis, or clubbing.  Skin: Warm and dry. No open lesions noted.   LABORATORY DATA:  None for this visit.  DIAGNOSTIC IMAGING:  None for this visit.     ASSESSMENT AND PLAN:   1. History of breast cancer: Stage 0 DCIS, ER/PR negative, S/P treatment as outlined above, with no clinical symptoms worrisome for disease recurrence, currently being followed in a program of surveillance.  Brittney Horton will follow-up with her radiation oncologist,  Dr. Lisbeth Horton, in January with history and physical exam per surveillance protocol.  She will be due mammography in December and I will coordinate this with Dr. Lisbeth Horton and follow up with Brittney Horton.  A comprehensive survivorship care plan and treatment summary was reviewed with the patient today detailing her breast cancer diagnosis, treatment course, potential late/long-term effects of treatment, appropriate follow-up care with recommendations for the future, and patient education resources.  A copy of this summary was provided to Brittney Horton with  a second copy provided for her to take to her new PCP at [facility] in [place] (she does not have their name at this time).  I have also sent a copy to Dr. Quay Burow who originally iniatited workup of Brittney Horton cancer via in basket message after today's visit.  Brittney Horton is welcome to return to the Survivorship Clinic in the future, as needed; no follow-up will be scheduled at this time.    2. Abdominal bloating / discomfort: I have encouraged Brittney Horton to discuss these symptoms with her new PCP and arrange for her annual pelvic examination.  Although her complaints are vague, I have stressed the need for her to have these evaluated to rule out something more serious than constipation. She  reports some abdominal discomfort but denies nausea, vomiting, or weight loss. She states that she will arrange this appointment following today's visit.  3. Cancer screening:  Due to Brittney Horton's history and her age, she should receive screening for skin cancers, colon cancer (upon reaching age 70), and gynecologic cancers.  The information and recommendations are listed on the patient's comprehensive care plan/treatment summary and were reviewed in detail with the patient.    4. Health maintenance and wellness promotion: Brittney Horton was encouraged to consume 5-7 servings of fruits and vegetables per day. We reviewed the "Nutrition Rainbow" handout, as well as the handout about "Nutrition for Breast Cancer Survivors."  She was also encouraged to engage in moderate to vigorous exercise for 30 minutes per day most days of the week. We discussed the [employer] fitness program, which is designed for cancer survivors to help them become more physically fit after cancer treatments.  She was instructed to limit her alcohol consumption and continue to abstain from tobacco use.   5. Support services/counseling: It is not uncommon for this period of the patient's cancer care trajectory to be one of many emotions and stressors.  We discussed an opportunity for her to participate in the next session of [facility] ("Finding Your New Normal") support group series designed for patients after they have completed treatment.   Brittney Horton was encouraged to take advantage of our many other support services programs, support groups, and/or counseling in coping with her new life as a cancer survivor after completing anti-cancer treatment.  She was offered support today through active listening and expressive supportive counseling.  She was given information regarding our available services and encouraged to contact me with any questions or for help enrolling in any of our support group/programs.    A total of 50 minutes of  face-to-face time was spent with this patient with greater than 50% of that time in counseling and care-coordination.   Sylvan Cheese, NP  Survivorship Program [facility] [phone]   Note: PRIMARY CARE PROVIDER [place], [place] [phone]

## 2015-04-16 ENCOUNTER — Other Ambulatory Visit: Payer: Self-pay | Admitting: Nurse Practitioner

## 2015-04-16 ENCOUNTER — Telehealth: Payer: Self-pay | Admitting: Nurse Practitioner

## 2015-04-16 DIAGNOSIS — C50412 Malignant neoplasm of upper-outer quadrant of left female breast: Secondary | ICD-10-CM

## 2015-04-16 NOTE — Telephone Encounter (Signed)
Called and spoke with patient regarding need for mammogram and follow up appointments.  She will call the Breast Clinic to arrange transfer of records / films and set up appointment for mammogram in December 2016.  She will see Dr. Lisbeth Renshaw in follow up on August 14, 2015 at 1:30 pm.  We will await findings from her mammogram report and she will report any change in her condition prior to that time.  When asked, she states the abdominal discomfort and bloating has improved.  Similarly, she will report if this returns and seek evaluation with her GYN.

## 2015-04-16 NOTE — Telephone Encounter (Signed)
Called and left a message for patient to call for her mammo as she is not a patient at the breast center but at [facility] in [place].

## 2015-05-05 ENCOUNTER — Telehealth: Payer: Self-pay | Admitting: *Deleted

## 2015-05-05 NOTE — Telephone Encounter (Signed)
Left vm for pt to return call for 3 mon f/u

## 2015-05-06 ENCOUNTER — Encounter: Payer: Self-pay | Admitting: Adult Health

## 2015-05-06 NOTE — Progress Notes (Signed)
A birthday card was mailed to the patient today on behalf of the Survivorship Program at [facility].   Gretchen Dawson, NP Survivorship Program [facility] [phone]  

## 2015-07-14 ENCOUNTER — Ambulatory Visit
Admission: RE | Admit: 2015-07-14 | Discharge: 2015-07-14 | Disposition: A | Discharge status: Home / Self Care | Payer: BLUE CROSS/BLUE SHIELD | Source: Ambulatory Visit | Attending: Nurse Practitioner | Admitting: Nurse Practitioner

## 2015-07-14 DIAGNOSIS — C50412 Malignant neoplasm of upper-outer quadrant of left female breast: Secondary | ICD-10-CM

## 2015-07-30 ENCOUNTER — Telehealth: Payer: Self-pay | Admitting: *Deleted

## 2015-07-30 NOTE — Telephone Encounter (Signed)
LM for mammo results- negative for any abnormalities.

## 2015-08-14 ENCOUNTER — Ambulatory Visit: Payer: Self-pay | Admitting: Radiation Oncology

## 2015-08-15 ENCOUNTER — Ambulatory Visit
Admission: RE | Admit: 2015-08-15 | Payer: BLUE CROSS/BLUE SHIELD | Source: Ambulatory Visit | Admitting: Radiation Oncology

## 2015-08-15 ENCOUNTER — Telehealth: Payer: Self-pay | Admitting: *Deleted

## 2015-08-15 NOTE — Telephone Encounter (Signed)
Called patient to see if she was running late  Or needs to reschedule,  She stated she called and left voice message cancelling her appt today due to changing insurance companies, she  Wants to wait 2-3 week, transferred call to Adam Phenix, to reschedule patient will notify MD 2:28 PM

## 2015-10-13 ENCOUNTER — Telehealth: Payer: Self-pay | Admitting: *Deleted

## 2015-10-13 NOTE — Telephone Encounter (Signed)
Returned phone message from patient who left message cancelling this Wednesdays follow up appt with Dr. Lisbeth Renshaw, she feels it isn't neccessary to keep this appt ,she just got a good report from her Primary MD,and  That she has new insurance that has a high deductable and cannot afford   Any new costs, I left her a voice message  That I would pass this message to MD and if he feels that she really needs to come in will give her call back 12:28 PM '

## 2015-10-15 ENCOUNTER — Ambulatory Visit: Payer: BLUE CROSS/BLUE SHIELD | Admitting: Radiation Oncology

## 2015-10-15 ENCOUNTER — Inpatient Hospital Stay
Admission: RE | Admit: 2015-10-15 | Discharge: 2015-10-15 | Disposition: A | Discharge status: Home / Self Care | Payer: BLUE CROSS/BLUE SHIELD | Source: Ambulatory Visit | Attending: Radiation Oncology | Admitting: Radiation Oncology

## 2015-10-15 ENCOUNTER — Ambulatory Visit
Admission: RE | Admit: 2015-10-15 | Payer: BLUE CROSS/BLUE SHIELD | Source: Ambulatory Visit | Admitting: Radiation Oncology

## 2015-10-15 ENCOUNTER — Inpatient Hospital Stay
Admission: RE | Admit: 2015-10-15 | Payer: BLUE CROSS/BLUE SHIELD | Source: Ambulatory Visit | Admitting: Radiation Oncology

## 2016-06-07 ENCOUNTER — Other Ambulatory Visit: Payer: Self-pay | Admitting: Nurse Practitioner

## 2016-06-07 DIAGNOSIS — Z853 Personal history of malignant neoplasm of breast: Secondary | ICD-10-CM

## 2016-06-08 ENCOUNTER — Other Ambulatory Visit: Payer: Self-pay | Admitting: General Surgery

## 2016-06-08 DIAGNOSIS — Z1231 Encounter for screening mammogram for malignant neoplasm of breast: Secondary | ICD-10-CM

## 2016-06-08 DIAGNOSIS — Z853 Personal history of malignant neoplasm of breast: Secondary | ICD-10-CM

## 2016-07-14 ENCOUNTER — Ambulatory Visit
Admission: RE | Admit: 2016-07-14 | Discharge: 2016-07-14 | Disposition: A | Discharge status: Home / Self Care | Payer: 59 | Source: Ambulatory Visit | Attending: General Surgery | Admitting: General Surgery

## 2016-07-14 DIAGNOSIS — Z853 Personal history of malignant neoplasm of breast: Secondary | ICD-10-CM

## 2016-11-08 IMAGING — MG MM DIAG BREAST TOMO UNI LEFT
2 series · 2 of 2 positions shown · non-contrast
Comparison: Previous exams

CLINICAL DATA: Status post MR guided core biopsy of the left
breast.

EXAM:
DIGITAL DIAGNOSTIC UNILATERAL LEFT MAMMOGRAM WITH 3D TOMOSYNTHESIS
POST MRI BIOPSY

[L ML tomo]
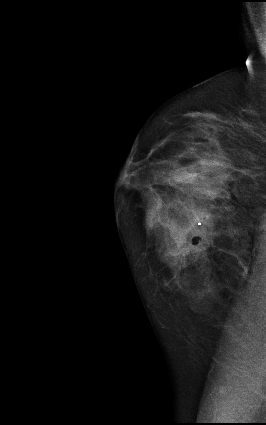

[L CC tomo]
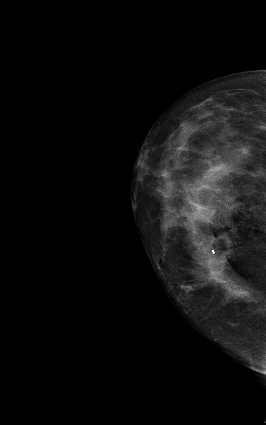

[2 of 2 positions shown; findings below may reference images not displayed]

FINDINGS: Mammographic images were obtained following MRI guided biopsy of in
enhancement and distortion in the upper-outer quadrant of the left
breast. An hourglass shaped clip is identified in the upper-outer
quadrant of the left breast within the area of distortion in the
upper-outer quadrant of the left breast. The clip is approximately
1.3 cm anterior, inferior, and medial to the center of the
distortion.
IMPRESSION: Tissue marker clip is in the general region of the distortion in the
upper-outer quadrant of the left breast. See above.

Final Assessment: Post Procedure Mammograms for Marker Placement

## 2017-07-01 ENCOUNTER — Other Ambulatory Visit: Payer: Self-pay | Admitting: General Surgery

## 2017-07-01 DIAGNOSIS — Z853 Personal history of malignant neoplasm of breast: Secondary | ICD-10-CM

## 2017-07-18 ENCOUNTER — Other Ambulatory Visit: Payer: Self-pay | Admitting: General Surgery

## 2017-07-18 ENCOUNTER — Other Ambulatory Visit: Payer: Self-pay

## 2017-07-18 DIAGNOSIS — Z853 Personal history of malignant neoplasm of breast: Secondary | ICD-10-CM

## 2017-07-19 ENCOUNTER — Ambulatory Visit
Admission: RE | Admit: 2017-07-19 | Discharge: 2017-07-19 | Disposition: A | Discharge status: Home / Self Care | Payer: 59 | Source: Ambulatory Visit | Attending: General Surgery | Admitting: General Surgery

## 2017-07-19 DIAGNOSIS — Z853 Personal history of malignant neoplasm of breast: Secondary | ICD-10-CM

## 2018-01-25 ENCOUNTER — Ambulatory Visit
Admission: RE | Admit: 2018-01-25 | Discharge: 2018-01-25 | Disposition: A | Discharge status: Home / Self Care | Payer: Worker's Compensation | Source: Ambulatory Visit | Attending: Physician Assistant | Admitting: Physician Assistant

## 2018-01-25 ENCOUNTER — Other Ambulatory Visit: Payer: Self-pay | Admitting: Physician Assistant

## 2018-01-25 ENCOUNTER — Ambulatory Visit: Payer: Self-pay

## 2018-01-25 DIAGNOSIS — M5412 Radiculopathy, cervical region: Secondary | ICD-10-CM | POA: Diagnosis present

## 2018-01-25 DIAGNOSIS — M4722 Other spondylosis with radiculopathy, cervical region: Secondary | ICD-10-CM | POA: Insufficient documentation

## 2018-01-25 DIAGNOSIS — M542 Cervicalgia: Secondary | ICD-10-CM | POA: Diagnosis present

## 2018-06-15 ENCOUNTER — Other Ambulatory Visit: Payer: Self-pay | Admitting: General Surgery

## 2018-06-15 DIAGNOSIS — Z853 Personal history of malignant neoplasm of breast: Secondary | ICD-10-CM

## 2018-07-20 ENCOUNTER — Ambulatory Visit
Admission: RE | Admit: 2018-07-20 | Discharge: 2018-07-20 | Disposition: A | Discharge status: Home / Self Care | Payer: 59 | Source: Ambulatory Visit | Attending: General Surgery | Admitting: General Surgery

## 2018-07-20 DIAGNOSIS — Z853 Personal history of malignant neoplasm of breast: Secondary | ICD-10-CM

## 2019-03-05 ENCOUNTER — Ambulatory Visit: Payer: Self-pay | Admitting: Obstetrics and Gynecology

## 2019-03-30 ENCOUNTER — Ambulatory Visit: Payer: Self-pay | Admitting: Obstetrics and Gynecology

## 2019-06-14 ENCOUNTER — Other Ambulatory Visit: Payer: Self-pay | Admitting: General Surgery

## 2019-06-14 DIAGNOSIS — Z853 Personal history of malignant neoplasm of breast: Secondary | ICD-10-CM

## 2019-06-14 DIAGNOSIS — Z9889 Other specified postprocedural states: Secondary | ICD-10-CM

## 2019-08-07 ENCOUNTER — Other Ambulatory Visit: Payer: Self-pay | Admitting: General Surgery

## 2019-08-07 DIAGNOSIS — Z9889 Other specified postprocedural states: Secondary | ICD-10-CM

## 2019-08-07 DIAGNOSIS — Z853 Personal history of malignant neoplasm of breast: Secondary | ICD-10-CM

## 2019-08-16 ENCOUNTER — Other Ambulatory Visit: Payer: Self-pay

## 2019-08-16 ENCOUNTER — Other Ambulatory Visit: Payer: Self-pay | Admitting: General Surgery

## 2019-08-16 ENCOUNTER — Ambulatory Visit
Admission: RE | Admit: 2019-08-16 | Discharge: 2019-08-16 | Disposition: A | Discharge status: Home / Self Care | Payer: 59 | Source: Ambulatory Visit

## 2019-08-16 DIAGNOSIS — Z853 Personal history of malignant neoplasm of breast: Secondary | ICD-10-CM

## 2019-08-16 DIAGNOSIS — Z9889 Other specified postprocedural states: Secondary | ICD-10-CM

## 2019-10-24 ENCOUNTER — Other Ambulatory Visit: Payer: Self-pay | Admitting: Hematology and Oncology

## 2019-10-24 DIAGNOSIS — Z853 Personal history of malignant neoplasm of breast: Secondary | ICD-10-CM

## 2019-10-24 DIAGNOSIS — Z9889 Other specified postprocedural states: Secondary | ICD-10-CM

## 2019-11-02 ENCOUNTER — Ambulatory Visit: Payer: 59 | Attending: Internal Medicine

## 2019-11-02 DIAGNOSIS — Z23 Encounter for immunization: Secondary | ICD-10-CM

## 2019-11-02 NOTE — Progress Notes (Signed)
   Covid-19 Vaccination Clinic  Name:  DEMIE DERUYTER    MRN: [id number] DOB: 12/13/1965  11/02/2019  Ms. Grandinetti was observed post Covid-19 immunization for 15 minutes without incident. She was provided with Vaccine Information Sheet and instruction to access the V-Safe system.   Ms. Leaders was instructed to call 911 with any severe reactions post vaccine: Marland Kitchen Difficulty breathing  . Swelling of face and throat  . A fast heartbeat  . A bad rash all over body  . Dizziness and weakness   Immunizations Administered    Name Date Dose VIS Date Route   Pfizer COVID-19 Vaccine 11/02/2019  8:30 AM 0.3 mL 07/13/2019 Intramuscular   Manufacturer: [place]   Lot: [id number]   [place]: [id number]

## 2019-11-27 ENCOUNTER — Ambulatory Visit: Payer: 59 | Attending: Internal Medicine

## 2019-11-27 DIAGNOSIS — Z23 Encounter for immunization: Secondary | ICD-10-CM

## 2019-11-27 NOTE — Progress Notes (Signed)
   Covid-19 Vaccination Clinic  Name:  Brittney Horton    MRN: [id number] DOB: [birth year]  11/27/2019  [name] was observed post Covid-19 immunization for 15 minutes without incident. She was provided with Vaccine Information Sheet and instruction to access the V-Safe system.   [name] was instructed to call 911 with any severe reactions post vaccine: Marland Kitchen Difficulty breathing  . Swelling of face and throat  . A fast heartbeat  . A bad rash all over body  . Dizziness and weakness   Immunizations Administered    Name Date Dose VIS Date Route   Pfizer COVID-19 Vaccine 11/27/2019 10:44 AM 0.3 mL 09/26/2018 Intramuscular   Manufacturer: [place]   Lot: [id number]   [place]: [id number]

## 2020-02-14 ENCOUNTER — Ambulatory Visit
Admission: RE | Admit: 2020-02-14 | Discharge: 2020-02-14 | Disposition: A | Discharge status: Home / Self Care | Payer: 59 | Source: Ambulatory Visit | Attending: Hematology and Oncology | Admitting: Hematology and Oncology

## 2020-02-14 ENCOUNTER — Other Ambulatory Visit: Payer: Self-pay | Admitting: Hematology and Oncology

## 2020-02-14 ENCOUNTER — Other Ambulatory Visit: Payer: Self-pay

## 2020-02-14 DIAGNOSIS — Z9889 Other specified postprocedural states: Secondary | ICD-10-CM

## 2020-02-14 DIAGNOSIS — R921 Mammographic calcification found on diagnostic imaging of breast: Secondary | ICD-10-CM

## 2020-02-14 DIAGNOSIS — Z853 Personal history of malignant neoplasm of breast: Secondary | ICD-10-CM

## 2020-02-14 HISTORY — DX: Personal history of irradiation: Z92.3

## 2020-07-18 ENCOUNTER — Other Ambulatory Visit: Payer: Self-pay

## 2020-07-18 DIAGNOSIS — R921 Mammographic calcification found on diagnostic imaging of breast: Secondary | ICD-10-CM

## 2020-08-12 ENCOUNTER — Other Ambulatory Visit: Payer: Self-pay

## 2020-08-12 ENCOUNTER — Ambulatory Visit: Payer: 59

## 2020-08-12 ENCOUNTER — Ambulatory Visit: Payer: Self-pay | Admitting: *Deleted

## 2020-08-12 VITALS — BP 140/90 | Wt 155.6 lb

## 2020-08-12 DIAGNOSIS — Z1239 Encounter for other screening for malignant neoplasm of breast: Secondary | ICD-10-CM

## 2020-08-12 NOTE — Progress Notes (Signed)
Ms. Bobbye Petti is a 55 y.o. female who presents to [facility] clinic today with no complaints. Patients last left breast diagnostic mammogram was completed 02/14/2020 that 49-month bilateral diagnostic mammogram recommended in 20-months.    Pap Smear: Pap smear not completed today. Last Pap smear was in October 2021 at [facility] in [place] and was normal per patient. Per patient has no history of an abnormal Pap smear. Last Pap smear result is not available in Epic. Previous Pap smear result is in Epic.   Physical exam: Breasts Right breast is larger than left breast due to patient has a history of a left breast lumpectomy in 2016 for breast cancer. No skin abnormalities bilateral breasts. No nipple retraction bilateral breasts. No nipple discharge bilateral breasts. No lymphadenopathy. No lumps palpated bilateral breasts. No complaints of pain or tenderness on exam.       Pelvic/Bimanual Pap is not indicated today per BCCCP guidelines.    Smoking History: Patient has never smoked.  Patient Navigation: Patient education provided. Access to services provided for patient through [place] program.   Colorectal Cancer Screening: Per patient has never had colonoscopy completed. Per patient completed a FIT Test between October-December 2021 and 08/17/2017 that was negative. No complaints today.    Breast and Cervical Cancer Risk Assessment: Patient has family history of a maternal aunt and a maternal first cousin having breast cancer. Patient has no known genetic mutations or history of radiation treatment to the chest before age 44. Patient does not have history of cervical dysplasia, immunocompromised, or DES exposure in-utero.  Risk Assessment    Risk Scores      08/12/2020   Last edited by: Royston Bake, CMA   5-year risk: 1 %   Lifetime risk: 7.2 %         A: BCCCP exam without pap smear No complaints.  P: Referred patient to the [facility]  for a bilateral diagnostic mammogram per recommendation. Appointment scheduled Tuesday, August 19, 2020 at 1310.  Loletta Parish, RN 08/12/2020 12:44 PM

## 2020-08-12 NOTE — Patient Instructions (Signed)
Explained breast self awareness with Julio Sicks. Patient did not need a Pap smear today due to last Pap smear was in October 2021 per patient. Let her know BCCCP will cover Pap smears every 3 years unless has a history of abnormal Pap smears. Referred patient to the [facility] for a bilateral diagnostic mammogram per recommendation. Appointment scheduled Tuesday, August 19, 2020 at 1310. Patient aware of appointment and will be there. Tiffannie Sloss verbalized understanding.  Calan Doren, Arvil Chaco, RN 12:44 PM

## 2020-08-19 ENCOUNTER — Ambulatory Visit
Admission: RE | Admit: 2020-08-19 | Discharge: 2020-08-19 | Disposition: A | Discharge status: Home / Self Care | Payer: No Typology Code available for payment source | Source: Ambulatory Visit | Attending: Obstetrics and Gynecology | Admitting: Obstetrics and Gynecology

## 2020-08-19 ENCOUNTER — Other Ambulatory Visit: Payer: Self-pay

## 2020-08-19 DIAGNOSIS — R921 Mammographic calcification found on diagnostic imaging of breast: Secondary | ICD-10-CM

## 2021-08-11 ENCOUNTER — Other Ambulatory Visit: Payer: Self-pay

## 2021-08-11 DIAGNOSIS — Z853 Personal history of malignant neoplasm of breast: Secondary | ICD-10-CM

## 2021-08-11 DIAGNOSIS — Z08 Encounter for follow-up examination after completed treatment for malignant neoplasm: Secondary | ICD-10-CM

## 2021-09-08 ENCOUNTER — Other Ambulatory Visit: Payer: Self-pay

## 2021-09-08 ENCOUNTER — Ambulatory Visit
Admission: RE | Admit: 2021-09-08 | Discharge: 2021-09-08 | Disposition: A | Discharge status: Home / Self Care | Payer: No Typology Code available for payment source | Source: Ambulatory Visit | Attending: Obstetrics and Gynecology | Admitting: Obstetrics and Gynecology

## 2021-09-08 ENCOUNTER — Ambulatory Visit: Payer: Self-pay | Admitting: *Deleted

## 2021-09-08 VITALS — BP 122/72 | HR 83 | Temp 98.8°F | Wt 152.9 lb

## 2021-09-08 DIAGNOSIS — N644 Mastodynia: Secondary | ICD-10-CM

## 2021-09-08 DIAGNOSIS — Z08 Encounter for follow-up examination after completed treatment for malignant neoplasm: Secondary | ICD-10-CM

## 2021-09-08 DIAGNOSIS — Z1239 Encounter for other screening for malignant neoplasm of breast: Secondary | ICD-10-CM

## 2021-09-08 NOTE — Patient Instructions (Signed)
Explained breast self awareness with Brittney Horton. Patient did not need a Pap smear today due to last Pap smear and HPV typing was 07/11/2020. Let her know BCCCP will cover Pap smears and HPV typing every 5 years unless has a history of abnormal Pap smears. Referred patient to the [facility] for a diagnostic mammogram per recommendation. Appointment scheduled Tuesday, September 08, 2021 at 1400. Patient aware of appointment and will be there. Brittney Horton verbalized understanding.  Brittney Horton, Brittney Chaco, RN 12:36 PM

## 2021-09-08 NOTE — Progress Notes (Addendum)
[name] is a 56 y.o. female who presents to [facility] clinic today with complaint of left outer breast and axillary pain that radiates to her arm x 6-7 months. Patient states the pain comes and goes. Patient rates the pain at a 6 out of 10. Patients last bilateral diagnostic mammogram was completed 08/19/2020 that was probably benign with a diagnostic mammogram recommended in one year for follow up.   Pap Smear: Pap smear not completed today. Last Pap smear was 07/11/2020 at [facility] in [place] and was normal per patient. Per patient has no history of an abnormal Pap smear. Last Pap smear result is not available in Epic. Previous Pap smear result is available in Epic.   Physical exam: Breasts Right breast is larger than left breast due to patient has a history of a left breast lumpectomy in 2016 for breast cancer. No skin abnormalities bilateral breasts. No nipple retraction bilateral breasts. No nipple discharge bilateral breasts. No lymphadenopathy. No lumps palpated bilateral breasts. Complaints of left outer breast and axillary pain on exam.  MM DIAG BREAST TOMO UNI LEFT  Result Date: 02/14/2020 CLINICAL DATA:  Six-month follow-up for likely benign calcifications in the patient's left breast lumpectomy site. She has history of left breast lumpectomy in 2016. EXAM: DIGITAL DIAGNOSTIC UNILATERAL LEFT MAMMOGRAM WITH TOMO AND CAD COMPARISON:  Previous exam(s). ACR Breast Density Category c: The breast tissue is heterogeneously dense, which may obscure small masses. FINDINGS: Progressive development of coarse heterogeneous calcifications at the left breast lumpectomy site, spanning 1.4 cm. On the tomosynthesis images through the lumpectomy site, the calcifications are noted to be developing around the periphery of an oil cyst, compatible with fat necrosis. Mammographic images were processed with CAD. IMPRESSION: Progressive development of coarse heterogeneous calcifications  lining an oil cyst at the lumpectomy site. This is compatible with fat necrosis and is likely benign. RECOMMENDATION: Six-month follow-up bilateral diagnostic mammogram. I have discussed the findings and recommendations with the patient. If applicable, a reminder letter will be sent to the patient regarding the next appointment. BI-RADS CATEGORY  3: Probably benign. Electronically Signed   By: Ammie Ferrier M.D.   On: 02/14/2020 16:11   MM DIAG BREAST TOMO BILATERAL  Result Date: 08/16/2019 CLINICAL DATA:  56 year old female with a history of left breast cancer in 2016 status post lumpectomy and radiation. EXAM: DIGITAL DIAGNOSTIC BILATERAL MAMMOGRAM WITH CAD AND TOMO COMPARISON:  Previous exam(s). ACR Breast Density Category c: The breast tissue is heterogeneously dense, which may obscure small masses. FINDINGS: Right breast: No suspicious mass, distortion, or microcalcifications are identified to suggest presence of malignancy. Left breast: There are postsurgical changes in the upper outer left breast at the lumpectomy site. There are a few new calcifications at the lumpectomy site, some of which appear to be vascular. Others at the periphery of lucency at the lumpectomy site may represent rim calcifications or early dystrophic calcifications. There is no new suspicious mass. Mammographic images were processed with CAD. IMPRESSION: At the lumpectomy site in the left breast there are a few calcifications which are probably benign and favored to represent vascular as well as early rim or dystrophic calcifications. RECOMMENDATION: Diagnostic left breast mammogram in 6 months. I have discussed the findings and recommendations with the patient. If applicable, a reminder letter will be sent to the patient regarding the next appointment. BI-RADS CATEGORY  3: Probably benign. Electronically Signed   By: Audie Pinto M.D.   On: 08/16/2019 13:17   MM  DIAG BREAST TOMO BILATERAL  Result Date:  07/20/2018 CLINICAL DATA:  History of LEFT breast cancer in 2016 status post lumpectomy and radiation therapy. EXAM: DIGITAL DIAGNOSTIC BILATERAL MAMMOGRAM WITH CAD AND TOMO COMPARISON:  Previous exam(s). ACR Breast Density Category c: The breast tissue is heterogeneously dense, which may obscure small masses. FINDINGS: There are stable postsurgical changes within the LEFT breast. There are no new dominant masses, suspicious calcifications or secondary signs of malignancy within either breast. Mammographic images were processed with CAD. IMPRESSION: No evidence of malignancy within either breast. Stable postsurgical changes within the LEFT breast. RECOMMENDATION: Bilateral diagnostic mammogram in 1 year. I have discussed the findings and recommendations with the patient. Results were also provided in writing at the conclusion of the visit. If applicable, a reminder letter will be sent to the patient regarding the next appointment. BI-RADS CATEGORY  2: Benign. Electronically Signed   By: Franki Cabot M.D.   On: 07/20/2018 10:59   MM DIAG BREAST TOMO BILATERAL  Result Date: 07/19/2017 CLINICAL DATA:  Status post left lumpectomy and radiation therapy for breast cancer in 2016. EXAM: 2D DIGITAL DIAGNOSTIC BILATERAL MAMMOGRAM WITH CAD AND ADJUNCT TOMO COMPARISON:  Previous exam(s). ACR Breast Density Category c: The breast tissue is heterogeneously dense, which may obscure small masses. FINDINGS: Stable post lumpectomy and postradiation changes on the left. No interval findings suspicious for malignancy in either breast. Mammographic images were processed with CAD. IMPRESSION: No evidence of malignancy. RECOMMENDATION: Bilateral diagnostic mammogram in 1 year. I have discussed the findings and recommendations with the patient. Results were also provided in writing at the conclusion of the visit. If applicable, a reminder letter will be sent to the patient regarding the next appointment. BI-RADS CATEGORY  2:  Benign. Electronically Signed   By: Claudie Revering M.D.   On: 07/19/2017 16:17   MS DIGITAL DIAG TOMO BILAT  Result Date: 08/19/2020 CLINICAL DATA:  Short-term follow-up for probably benign calcifications in the left breast lumpectomy bed, initially assessed in January 2021. EXAM: DIGITAL DIAGNOSTIC BILATERAL MAMMOGRAM WITH TOMO AND CAD TECHNIQUE: Bilateral digital diagnostic mammography and breast tomosynthesis was performed. Digital images of the breasts were evaluated with computer-aided detection. COMPARISON:  Previous exam(s). ACR Breast Density Category c: The breast tissue is heterogeneously dense, which may obscure small masses. FINDINGS: Calcifications in the lumpectomy a bed have increased, becoming more consistent with fat necrosis calcifications, aligned along the periphery of an area of central fat attenuation. There are no masses or areas of nonsurgical architectural distortion. IMPRESSION: Probably benign calcifications in the left breast lumpectomy bed consistent with fat necrosis. One additional follow-up diagnostic exam to establish 2 years of follow-up is recommended. RECOMMENDATION: Diagnostic mammography with left breast magnification views in 1 year. I have discussed the findings and recommendations with the patient. If applicable, a reminder letter will be sent to the patient regarding the next appointment. BI-RADS CATEGORY  3: Probably benign. Electronically Signed   By: Lajean Manes M.D.   On: 08/19/2020 13:22      Pelvic/Bimanual Pap is not indicated today per BCCCP guidelines.   Smoking History: Patient has never smoked.   Patient Navigation: Patient education provided. Access to services provided for patient through [facility] program.   Colorectal Cancer Screening: Per patient has never had colonoscopy completed. Per patient completed a FIT Test October 2022 that she stated she has not received the result, 06/16/2020 that was negative, and 08/17/2017 that was negative. No  complaints today.    Breast and  Cervical Cancer Risk Assessment: Patient has family history of a maternal aunt and a maternal first cousin having breast cancer. Patient has a personal history of breast cancer. Patient has no known genetic mutations or history of radiation treatment to the chest before age 73. Patient does not have history of cervical dysplasia, immunocompromised, or DES exposure in-utero.  Risk Assessment     Risk Scores       09/08/2021 08/12/2020   Last edited by: Demetrius Revel, LPN Royston Bake, CMA   5-year risk: 1 % 1 %   Lifetime risk: 7 % 7.2 %           A: BCCCP exam without pap smear Complaint of left breast and axillary pain.  P: Referred patient to the [facility] for a diagnostic mammogram per recommendation. Appointment scheduled Tuesday, September 08, 2021 at 1400.  Loletta Parish, RN 09/08/2021 12:36 PM

## 2022-08-09 ENCOUNTER — Other Ambulatory Visit: Payer: Self-pay | Admitting: Obstetrics and Gynecology

## 2022-08-09 DIAGNOSIS — Z853 Personal history of malignant neoplasm of breast: Secondary | ICD-10-CM

## 2022-10-04 ENCOUNTER — Encounter: Payer: Self-pay | Admitting: Family Medicine

## 2022-10-04 ENCOUNTER — Other Ambulatory Visit: Payer: Self-pay | Admitting: Obstetrics and Gynecology

## 2022-10-04 ENCOUNTER — Ambulatory Visit (INDEPENDENT_AMBULATORY_CARE_PROVIDER_SITE_OTHER): Payer: BLUE CROSS/BLUE SHIELD | Admitting: Family Medicine

## 2022-10-04 VITALS — BP 162/89 | HR 79 | Ht 64.0 in | Wt 155.4 lb

## 2022-10-04 DIAGNOSIS — Z711 Person with feared health complaint in whom no diagnosis is made: Secondary | ICD-10-CM

## 2022-10-04 DIAGNOSIS — C50412 Malignant neoplasm of upper-outer quadrant of left female breast: Secondary | ICD-10-CM | POA: Diagnosis not present

## 2022-10-04 DIAGNOSIS — Z171 Estrogen receptor negative status [ER-]: Secondary | ICD-10-CM

## 2022-10-04 DIAGNOSIS — D0512 Intraductal carcinoma in situ of left breast: Secondary | ICD-10-CM

## 2022-10-04 DIAGNOSIS — N644 Mastodynia: Secondary | ICD-10-CM

## 2022-10-04 NOTE — Progress Notes (Signed)
   GYNECOLOGY PROBLEM  VISIT ENCOUNTER NOTE  Subjective:   Brittney Horton is a 57 y.o. G77P0 female here for a problem GYN visit.    Current complaints: left outer breast concern-- she had a left lumpectomy in 2016. Left breast is tender. No nipple discharge. Noted redness for the past month. It is associated with some itching and pain deep in the breast.   2016- had left breast cancer--DCIS and had lumpectomy and radiation at that time.  Reviewed pathology from 2016 which showed negative estrogen receptor  Denies abnormal vaginal bleeding, discharge, pelvic pain, problems with intercourse or other gynecologic concerns.    Gynecologic History No LMP recorded (lmp unknown). Patient is postmenopausal.  Contraception: post menopausal status  Health Maintenance Due  Topic Date Due   HIV Screening  Never done   Hepatitis C Screening  Never done   DTaP/Tdap/Td (1 - Tdap) Never done   Zoster Vaccines- Shingrix (1 of 2) Never done   PAP SMEAR-Modifier  Never done   COLONOSCOPY (Pts 45-30yr Insurance coverage will need to be confirmed)  Never done   COVID-19 Vaccine (3 - Pfizer risk series) 12/25/2019   INFLUENZA VACCINE  03/02/2022    The following portions of the patient's history were reviewed and updated as appropriate: allergies, current medications, past family history, past medical history, past social history, past surgical history and problem list.  Review of Systems Pertinent items are noted in HPI.   Objective:  BP (!) 162/89   Pulse 79   Ht '5\' 4"'$  (1.626 m)   Wt 155 lb 6.4 oz (70.5 kg)   LMP  (LMP Unknown)   BMI 26.67 kg/m  Gen: well appearing, NAD HEENT: no scleral icterus CV: RR Lung: Normal WOB Ext: warm well perfused  Breast: Asymmetric, everted nipples bilaterally.  Right: non TTP. No masses. No skin changes Left: Slightly larger appearing left breast. + erythematous rash over the left breast. TTP behind the nipple and laterally. 1 cm mass at the 3 o'clock  that is TTP. Physical Exam Exam conducted with a chaperone present.  Chest:  Breasts:    Tanner Score is 5.     Right: Normal. No swelling, bleeding, mass or nipple discharge.     Left: Swelling, mass and skin change present. No nipple discharge.          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 Malignant neoplasm of upper-outer quadrant of left breast in female, estrogen receptor negative ([place] - MM Digital Diagnostic Bilat; Future  3. Concern about female breast disease without diagnosis I am concerned about the skin and exam finding for this patient. There is a concern for post radiation inflammation vs inflammatory breast cancer.  - RN SEvern Corecalled the BMolineand the earliest appt was 3/19.  - MM Digital Diagnostic Bilat; Future  .  Please refer to After Visit Summary for other counseling recommendations.   No follow-ups on file.  Future Appointments  Date Time Provider [place] 10/05/2022  9:10 AM GI-[facility] DIAG TOMO 3 GI-BCGMM GI-BREAST CE  10/05/2022  9:20 AM [facility]G [place]3 GI-BCGUS GI-BREAST CE     KCaren Macadam MD, MPH, ABFM Attending [place]for [facility]

## 2022-10-05 ENCOUNTER — Ambulatory Visit
Admission: RE | Admit: 2022-10-05 | Discharge: 2022-10-05 | Disposition: A | Discharge status: Home / Self Care | Payer: No Typology Code available for payment source | Source: Ambulatory Visit | Attending: Obstetrics and Gynecology | Admitting: Obstetrics and Gynecology

## 2022-10-05 ENCOUNTER — Ambulatory Visit
Admission: RE | Admit: 2022-10-05 | Discharge: 2022-10-05 | Disposition: A | Discharge status: Home / Self Care | Payer: BLUE CROSS/BLUE SHIELD | Source: Ambulatory Visit | Attending: Obstetrics and Gynecology | Admitting: Obstetrics and Gynecology

## 2022-10-05 DIAGNOSIS — Z853 Personal history of malignant neoplasm of breast: Secondary | ICD-10-CM

## 2022-10-05 DIAGNOSIS — N644 Mastodynia: Secondary | ICD-10-CM

## 2022-10-19 ENCOUNTER — Other Ambulatory Visit: Payer: No Typology Code available for payment source

## 2023-10-28 ENCOUNTER — Ambulatory Visit
Admission: RE | Admit: 2023-10-28 | Discharge: 2023-10-28 | Disposition: A | Discharge status: Home / Self Care | Source: Ambulatory Visit | Attending: Emergency Medicine | Admitting: Emergency Medicine

## 2023-10-28 VITALS — BP 127/86 | HR 99 | Temp 98.7°F | Resp 16

## 2023-10-28 DIAGNOSIS — R300 Dysuria: Secondary | ICD-10-CM

## 2023-10-28 DIAGNOSIS — N390 Urinary tract infection, site not specified: Secondary | ICD-10-CM

## 2023-10-28 DIAGNOSIS — R350 Frequency of micturition: Secondary | ICD-10-CM | POA: Diagnosis not present

## 2023-10-28 LAB — URINALYSIS, W/ REFLEX TO CULTURE (INFECTION SUSPECTED)
Bilirubin Urine: NEGATIVE
Glucose, UA: NEGATIVE mg/dL
Hgb urine dipstick: NEGATIVE
Ketones, ur: NEGATIVE mg/dL
Leukocytes,Ua: NEGATIVE
Nitrite: NEGATIVE
Protein, ur: NEGATIVE mg/dL
Specific Gravity, Urine: <1.005 — ABNORMAL LOW (ref 1.005–1.030)
pH: 6 (ref 5.0–8.0)

## 2023-10-28 MED ORDER — CEFUROXIME AXETIL 500 MG PO TABS: 500.0000 mg | ORAL_TABLET | Freq: Two times a day (BID) | ORAL | 0 refills | Status: AC

## 2023-10-28 NOTE — Discharge Instructions (Addendum)
 We are treating you for UTI. Take antibiotic as directed(ceftin). Urine culture pending. Drink plenty of water, follow up with PCP.   Go to Er for new or worsening issues or concerns(fever, nausea, vomiting, unable to keep meds down, muscle aches, etc)

## 2023-10-28 NOTE — ED Triage Notes (Signed)
 Sx since Monday  Urinary frequency Burning with urination Lower abdominal pressure.

## 2023-10-28 NOTE — ED Provider Notes (Signed)
 [facility]    CSN: [id number] Arrival date & time: 10/28/23  1031      History   Chief Complaint Chief Complaint  Patient presents with   Urinary Frequency    I belive I have a uti. - Entered by patient   Abdominal Pain   Dysuria    HPI Brittney Horton is a 58 y.o. female.   58 year old female, Brittney Horton, presents to urgent care for evaluation of urinary frequency, burning with urination, suprapubic abdominal pressure x 4 days.  Patient has a past medical history of recurrent UTIs:  last one was 6 months prior.  Patient denies any fever vomiting, reports frequent diarrhea with cancer treatment  PMH: Stage IV breast/ brain cancer per pt report  The history is provided by the patient. No language interpreter was used.    Past Medical History:  Diagnosis Date   Anxiety    Breast cancer (HCC) 08/01/14 bx   left breast LOQ   Headache    Hypertension    Personal history of radiation therapy    S/P radiation therapy 10/10/14-11/28/14   left breast 60.4Gy    Patient Active Problem List   Diagnosis Date Noted   Dysuria 10/28/2023   Urinary frequency 10/28/2023   Acute UTI 10/28/2023   Malignant neoplasm of upper-outer quadrant of left female breast (HCC) 10/30/2014   Breast cancer in situ 09/06/2014   Family history of malignant neoplasm of breast 09/06/2014   Breast cancer of upper-outer quadrant of left female breast (HCC) 08/07/2014    Past Surgical History:  Procedure Laterality Date   BREAST BIOPSY Left 07/2014   BREAST BIOPSY Left 06/2014   BREAST LUMPECTOMY Left 09/06/2014   BREAST LUMPECTOMY WITH AXILLARY LYMPH NODE BIOPSY Left 09/06/14   ductal ca in situ, high grade   CESAREAN SECTION     x5   DIAGNOSTIC LAPAROSCOPY     tubal preg   TUBAL LIGATION      OB History     Gravida  7   Para      Term      Preterm      AB      Living         SAB      IAB      Ectopic      Multiple      Live Births  5             Home Medications    Prior to Admission medications   Medication Sig Start Date End Date Taking? Authorizing Provider  cefUROXime (CEFTIN) 500 MG tablet Take 1 tablet (500 mg total) by mouth 2 (two) times daily with a meal for 7 days. 10/28/23 11/04/23 Yes Marixa Mellott, Para March, NP  OLANZapine (ZYPREXA) 5 MG tablet Take by mouth. 09/29/23 09/28/24 Yes [provider]  amLODipine (NORVASC) 5 MG tablet Take 5 mg by mouth daily.  08/06/14  Yes [provider]  ELIQUIS 5 MG TABS tablet Take 5 mg by mouth 2 (two) times daily.   Yes [provider]  metoprolol succinate (TOPROL-XL) 50 MG 24 hr tablet Take 50 mg by mouth daily.  08/06/14  Yes [provider]  VALTREX 500 MG tablet Take 500 mg by mouth 2 (two) times daily.    [provider]    Family History Family History  Problem Relation Age of Onset   Other Father        father died in his  40s and patient does not know any paternal family history   Cancer Cousin        mat first cousin diagnosed with breast cancer at a young age (female)   Breast cancer Maternal Aunt     Social History Social History   Tobacco Use   Smoking status: Never   Smokeless tobacco: Never  Vaping Use   Vaping status: Never Used  Substance Use Topics   Alcohol use: Yes    Comment: occ   Drug use: No     Allergies   Lisinopril and Propranolol hcl   Review of Systems Review of Systems  Constitutional:  Negative for fever.  Gastrointestinal:  Positive for abdominal pain. Negative for nausea and vomiting.  Genitourinary:  Positive for dysuria and frequency.  Musculoskeletal:  Negative for back pain and myalgias.  All other systems reviewed and are negative.    Physical Exam Triage Vital Signs ED Triage Vitals [10/28/23 1042]  Encounter Vitals Group     BP 127/86     Systolic BP Percentile      Diastolic BP Percentile      Pulse Rate 99     Resp 16     Temp 98.7 F (37.1 C)     Temp Source Oral      SpO2 97 %     Weight      Height      Head Circumference      Peak Flow      Pain Score      Pain Loc      Pain Education      Exclude from Growth Chart    No data found.  Updated Vital Signs BP 127/86 (BP Location: Left Arm)   Pulse 99   Temp 98.7 F (37.1 C) (Oral)   Resp 16   LMP  (LMP Unknown)   SpO2 97%   Visual Acuity Right Eye Distance:   Left Eye Distance:   Bilateral Distance:    Right Eye Near:   Left Eye Near:    Bilateral Near:     Physical Exam Vitals and nursing note reviewed.  Constitutional:      General: She is not in acute distress.    Appearance: She is well-developed and well-groomed.  HENT:     Head: Normocephalic and atraumatic.  Eyes:     Conjunctiva/sclera: Conjunctivae normal.  Cardiovascular:     Rate and Rhythm: Normal rate.     Heart sounds: No murmur heard. Pulmonary:     Effort: Pulmonary effort is normal. No respiratory distress.  Abdominal:     Tenderness: There is abdominal tenderness in the suprapubic area.  Musculoskeletal:        General: No swelling.     Cervical back: Neck supple.  Skin:    General: Skin is warm and dry.     Capillary Refill: Capillary refill takes less than 2 seconds.  Neurological:     General: No focal deficit present.     Mental Status: She is alert and oriented to person, place, and time.     GCS: GCS eye subscore is 4. GCS verbal subscore is 5. GCS motor subscore is 6.  Psychiatric:        Attention and Perception: Attention normal.        Mood and Affect: Mood normal.        Speech: Speech normal.        Behavior: Behavior normal. Behavior is cooperative.  UC Treatments / Results  Labs (all labs ordered are listed, but only abnormal results are displayed) Labs Reviewed  URINALYSIS, W/ REFLEX TO CULTURE (INFECTION SUSPECTED) - Abnormal; Notable for the following components:      Result Value   Specific Gravity, Urine <1.005 (*)    Bacteria, UA FEW (*)    All other components  within normal limits  URINE CULTURE    EKG   Radiology No results found.  Procedures Procedures (including critical care time)  Medications Ordered in UC Medications - No data to display  Initial Impression / Assessment and Plan / UC Course  I have reviewed the triage vital signs and the nursing notes.  Pertinent labs & imaging results that were available during my care of the patient were reviewed by me and considered in my medical decision making (see chart for details).    Discussed exam findings and plan of care with patient, will treat with Ceftin,UC sent, strict go to ER precautions given.   Patient verbalized understanding to this provider.  Ddx: Acute uti, dysuria, urinary frequency, IC Final Clinical Impressions(s) / UC Diagnoses   Final diagnoses:  Dysuria  Urinary frequency  Acute UTI     Discharge Instructions      We are treating you for UTI. Take antibiotic as directed(ceftin). Urine culture pending. Drink plenty of water, follow up with PCP.   Go to Er for new or worsening issues or concerns(fever, nausea, vomiting, unable to keep meds down, muscle aches, etc)    ED Prescriptions     Medication Sig Dispense Auth. Provider   cefUROXime (CEFTIN) 500 MG tablet Take 1 tablet (500 mg total) by mouth 2 (two) times daily with a meal for 7 days. 14 tablet Kavish Lafitte, Para March, NP      PDMP not reviewed this encounter.   Clancy Gourd, NP 10/28/23 1324

## 2023-10-29 LAB — URINE CULTURE: Culture: <10000 — AB

## 2024-03-20 NOTE — Progress Notes (Addendum)
 ------------------------------------------------------------------------------- Attestation signed by Brittney Horton, Brittney Ford, MD at 03/22/24 1015 I have personally seen the patient with the resident physician responsible for the patient encounter. We have discussed the history, physical exam and formulated an assessment plan together. I have reviewed the note for accuracy and appropriate coding.   Brittney L. Almodovar Suarez, MD Clinical Associate Professor of Neurology [facility]  -------------------------------------------------------------------------------    Outpatient Neurology Consult Note   [facility] [address] [place] [address]  Date: 03/20/2024 Patient Name: Brittney Horton MRN: [id number] PCP: Brittney Horton Brittney Slater Darice, MD   Assessment and Plan      [name] is a 58 y.o. female presenting in consultation for evaluation of transient left sided numbness.  Return Visit in: 3 months  I personally spent 40 minutes face-to-face and non-face-to-face in the care of this patient, which includes all pre, intra, and post visit time on the date of service.  Patient was seen and discussed with Dr.Almodovar who agrees with assessment and plan.   # Left-sided numbness c/f seizures Presents with episodic left-sided numbness and tingling in the last 2-3 months which would resolve within 20 minutes.. Location can vary (arm, leg, face), but always on the left side. Exam grossly normal. The episodic nature and frequency of events are suspicious for seizure events. Given history of metastatic breast cancer and known leptomeningeal disease s/p WBRT, there may be some irritation of the the right parietal area from either spread of LMD or radiation therapy which is a common occurrence. Granted, leptomeningeal enhancement has only been noted in the LEFT parietal occipital region in prior  MRIs which would not localize to her current symptoms. She has no visual deficits. Last MRI on 02/23/2024 stable. Other differentials considered are TIA. However this is would unlikely given the frequency, and that the fact that she is on anticoagulant anyway. Unlikely to be migrainous sensory given no other associated symptoms for migraines and quick resolution.   Regardless, given history of leptomeningeal disease and whole brain radiation therapy, patient is an increased risk for seizures at baseline. We will plan for EEG to look for abnormal spikes. Even if the EEG do not show abnormalities, it does not rule out the possibility that these are seizure events.  Thus, we are recommending starting keppra 500 mg BID. However, patient was hesitant to start ASM at this time due to concerns that it may interact with her cancer treatment.   Later, I received a messaged that patient is okay to start keppra 500 mg BID.   Plan  - Start Keppra 500 mg BID  - Will reach out to referring provider Brittney Horton to ensure that starting keppra 500 mg BID will not impact chemotherapy - EEG - Return in 3 months   Brittney Oar, MD PGY 2 Neurology      HPI      HPI: [name] is a 58 y.o. RH female seen in consultation at the [facility] at the request of Dr. Alyse for evaluation of left sided weakness.    Pt is currently on immunotherapy pembrolizumab for breast cancer. 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  All symptoms have only been on the left. Reports initial event with left sided numbness with expressive aphasia that lasted 20 minutes. Another episode of sudden numbness that progressed from legs -> hands ->  face for 20 minutes. Only reports one episode associated with left arm weakness and numbness that also resolved within 20 mins. Reports an episode of numbness that started on her left 5th finger  that gradually travels to her shoulders. Reports tingling, numbness sensation (not painful).  Reports 5 spells in the last 3 months.  During the interview, pt reports that her left arm was going numb on her left forearm that resolved with in 1 minute.   Per outpatient oncologist  11/08/22: Brain MRI done for headaches: suspicious area of LMD in left parietal occipital region. Further workup with MRI CTL spine and LP negative for other signs of LMD.  --s/p WBRT in 10 Fx 5/13-5/24/24 --Repeat Brain MRI 09/27/23: stable. --11/10/23: two transient neurologic episodes (arm numbness, difficulty speaking, darkening of vision).  Brain MRI stable 12/16/23: Similar appearance of posttreatment changes to the left occipital lobe without evidence of disease recurrence. No new intracranial metastasis. --01/30/24: additional transient neurologic episode ( L arm numbness, L facial numbness, transient. --02/28/24:. She had sudden onset left leg numbness that traveled up the left side and then involved the left hand which was completely numb. The episode lasted 20 minutes then resolved.  --03/06/24 L pinky finger numbness   Allergies[1]   Current Medications[2]  Past Medical History[3]  Past Surgical History[4]  Social History[5]  Family History[6]      Objective      Vital signs: BP 125/71 (BP Site: R Arm, BP Position: Sitting, BP Cuff Size: Medium)   Pulse 76   Ht 162.6 cm (5' 4)   Wt 68.3 kg (150 lb 8 oz)   LMP 08/30/2016   BMI 25.83 kg/m    Vitals:   03/20/24 1440  BP Site: R Arm  BP Position: Sitting  BP Cuff Size: Medium    Physical Exam: General Appearance:Well appearing. In no acute distress. HEENT: Head is atraumatic and normocephalic. Sclera anicteric without injection. Oropharyngeal membranes are moist with no erythema or exudate. Neck: Supple. Lungs: Clear to auscultation in anterior fields. No wheezes or crackles. Heart: Regular rate and rhythm. No murmurs, rubs, or  gallops. Abdomen: Soft, nontender, nondistended. Extremities: No clubbing, cyanosis, or edema.  Neurological Examination:   Mental Status: Alert, conversant, able to follow conversation and interview. Spontaneous speech was fluent without word finding pauses, dysarthria, or paraphasic errors. Comprehension was intact to simple and multi-step commands. Memory for recent and remote events was intact.  Cranial Nerves: PERRL Pursuit eye movements were uninterrupted with full range. Facial sensation intact bilaterally to light touch on the forehead, cheek, and chin. Face symmetric at rest. Normal facial movement bilaterally, including forehead, eye closure and grimace/smile. Hearing intact to conversation. Shoulder shrug full strength bilaterally. Palate movement is symmetric. Tongue protrudes midline and tongue movements are normal.  Motor Exam: Normal bulk. No tremors, myoclonus, or other adventitious movement. Pronator drift is absent. UE R/L: deltoid 5/5, biceps 5/5, triceps 5/5, and hand grip strong/strong. LE R/L: hip flexion 5/5, quadriceps 5/5, hamstrings 5/5, dorsiflexion 5/5, and plantar flexion 5/5.   Reflexes:  R L  Biceps +2 +2  Brachioradialis +2 +2  Triceps +2 +2  Patella +2 +2  Achilles +2 +2  Toes are downgoing bilaterally.  Sensory: Sensation normal to light touch and pinprick in both hands and both feet and to position sense and vibration distally in the fingers and toes.  Cerebellar/Coordination/Gait: Gait exam demonstrates normal posture, base, stride length, arm swing and turns.      Diagnostic Studies and  Review of Records  Imaging: MRI brain 11/08/2022 cortical/leptomeningeal metastasis in the left parietal occipital region  MRI CTL 12/07/2022 Normal appearance of the spine without evidence of leptomeningeal disease.     MRI brain 03/10/2023 Decreased leptomeningeal/cortical metastatic lesion involving the left medial parieto-occipital region post therapy  with new encephalomalacia consistent with recent post radiation therapy. No new lesions.      MRI brain 06/08/2023 Similar posttreatment findings within the left parieto-occipital lobe without enhancement.    No new metastatic lesions.   MRI brain 09/27/2023 -Stable posttreatment changes to the left occipital lobe without enhancement.  -No new enhancing lesions.   MRI brain 12/16/2023 Similar appearance of posttreatment changes to the left occipital lobe without evidence of disease recurrence.    No new intracranial metastasis.   MRI brain 02/23/2024 Posttreatment changes in left paramedian occipital lobe.  No evidence of recurrent disease.  No new intracranial lesions.         [1] No Known Allergies [2] Current Outpatient Medications  Medication Sig Dispense Refill  . amlodipine (NORVASC) 5 MG tablet Take 1 tablet (5 mg total) by mouth daily. 90 tablet 2  . apixaban (ELIQUIS) 5 mg Tab Take 1 tablet (5 mg total) by mouth two (2) times a day. 60 tablet 2  . cetirizine (ZYRTEC) 10 MG tablet Take 1 tablet (10 mg total) by mouth daily. 30 tablet 2  . fluticasone propionate (FLONASE) 50 mcg/actuation nasal spray 2 sprays into each nostril two (2) times a day. (Patient taking differently: 2 sprays into each nostril daily as needed.) 16 g 3  . metoPROLOL succinate (TOPROL-XL) 50 MG 24 hr tablet Take 1 tablet (50 mg total) by mouth daily. 90 tablet 2  . triamcinolone (KENALOG) 0.1 % ointment Apply topically two (2) times a day. Apply to affected area twice daily until improved or for up to 2 weeks, whichever is sooner. Break for 1-2 weeks. Restart as needed. 80 g 1   No current facility-administered medications for this visit.   Facility-Administered Medications Ordered in Other Visits  Medication Dose Route Frequency Provider Last Rate Last Admin  . OKAY TO SEND MEDICATION/CHEMOTHERAPY TO OUTPATIENT UNIT   Other Once Myer Reyes BIRCH, MD      . sodium chloride  (NS) 0.9 %  infusion  100 mL/hr Intravenous Continuous Myer Reyes BIRCH, MD   Stopped at 02/24/23 1429  [3] Past Medical History: Diagnosis Date  . Brain cancer     11/2022  . Breast cancer     2016   Left breast  . Change in vision   . 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  . HL (hearing loss)   . Hypertension 2012  . Metastatic cancer     4/224  . Pulmonary embolism     01/09/2023   DVT RLE  . Skin cancer 2019  . Squamous cell skin cancer   [4] Past Surgical History: Procedure Laterality Date  . BREAST BIOPSY Left 2016   malignant  . BREAST BIOPSY Left 10/2022   punch bx inflammatory with mets  . BREAST LUMPECTOMY Left 2016   With left axillary sentinel lymp node biopsy  . CESAREAN SECTION     x4  . CHEMOTHERAPY    . DIAGNOSTIC LAPAROSCOPY     For tubal pregnancy, removed right fallopian tube  . IR INSERT PORT AGE GREATER THAN 5 YRS  01/05/2023   IR INSERT PORT AGE GREATER  THAN 5 YRS 01/05/2023 Evron, Charmaine Lesches, PA IMG VIR HBR  . RADIATION Left 2016  . RADIATION Left 2024  [5] Social History Socioeconomic History  . Marital status: Single    Spouse name: None  . Number of children: None  . Years of education: None  . Highest education level: None  Tobacco Use  . Smoking status: Never  . Smokeless tobacco: Never  Vaping Use  . Vaping status: Never Used  Substance and Sexual Activity  . Alcohol use: Not Currently    Alcohol/week: 4.0 standard drinks of alcohol    Types: 4 Cans of beer per week  . Drug use: Never  . Sexual activity: Not Currently    Partners: Male    Birth control/protection: None  Other Topics Concern  . Do you use sunscreen? No  . Tanning bed use? Yes  . Are you easily burned? Yes  . Excessive sun exposure? Yes  . Blistering sunburns? Yes   Social Drivers of Health   Financial Resource Strain: Low Risk  (11/02/2022)   Overall Financial Resource Strain (CARDIA)    . Difficulty of Paying Living Expenses: Not hard at all  Food Insecurity: No Food Insecurity (11/02/2022)   Hunger Vital Sign   . Worried About Programme researcher, broadcasting/film/video in the Last Year: Never true   . Ran Out of Food in the Last Year: Never true  Transportation Needs: No Transportation Needs (11/02/2022)   PRAPARE - Transportation   . Lack of Transportation (Medical): No   . Lack of Transportation (Non-Medical): No  Housing: Low Risk  (11/02/2022)   Housing   . Within the past 12 months, have you ever stayed: outside, in a car, in a tent, in an overnight shelter, or temporarily in someone else's home (i.e. couch-surfing)?: No   . Are you worried about losing your housing?: No  [6] Family History Problem Relation Age of Onset  . Hypertension Mother   . Diabetes Mother   . Heart attack Mother   . Stroke Mother   . Hypertension Brother   . Heart attack Maternal Grandmother   . Hypertension Maternal Grandmother   . Diabetes Maternal Grandmother   . Pancreatic cancer Maternal Grandmother        Age of diagnosis unclear  . Cancer Maternal Grandmother   . Breast cancer Maternal Cousin        Premenopausal diagnosis  . Colon cancer Maternal Uncle   . Cancer Maternal Aunt   . Cancer Maternal Uncle        Has colon cancer  . Melanoma Neg Hx   . Basal cell carcinoma Neg Hx   . Squamous cell carcinoma Neg Hx

## 2024-04-03 ENCOUNTER — Ambulatory Visit

## 2024-04-04 ENCOUNTER — Ambulatory Visit: Attending: Internal Medicine

## 2024-04-04 DIAGNOSIS — M6281 Muscle weakness (generalized): Secondary | ICD-10-CM | POA: Insufficient documentation

## 2024-04-04 DIAGNOSIS — R2689 Other abnormalities of gait and mobility: Secondary | ICD-10-CM | POA: Diagnosis present

## 2024-04-04 DIAGNOSIS — R5381 Other malaise: Secondary | ICD-10-CM | POA: Insufficient documentation

## 2024-04-04 NOTE — Therapy (Signed)
 OUTPATIENT PHYSICAL THERAPY LOWER EXTREMITY EVALUATION 04/04/24   Patient Name: Brittney Horton MRN: [id number] DOB:Apr 16, 1966, 58 y.o., female Today's Date: 04/07/2024  END OF SESSION:  PT End of Session - 04/07/24 1458     Visit Number 1    Number of Visits 17    Date for PT Re-Evaluation 05/30/24    Authorization Type 8 weeks (2x/week) requested    PT Start Time 1115    PT Stop Time 1200    PT Time Calculation (min) 45 min    Activity Tolerance Patient tolerated treatment well    Behavior During Therapy [facility] for tasks assessed/performed          Past Medical History:  Diagnosis Date   Anxiety    Breast cancer (HCC) 08/01/14 bx   left breast LOQ   Headache    Hypertension    Personal history of radiation therapy    S/P radiation therapy 10/10/14-11/28/14   left breast 60.4Gy   Past Surgical History:  Procedure Laterality Date   BREAST BIOPSY Left 07/2014   BREAST BIOPSY Left 06/2014   BREAST LUMPECTOMY Left 09/06/2014   BREAST LUMPECTOMY WITH AXILLARY LYMPH NODE BIOPSY Left 09/06/14   ductal ca in situ, high grade   CESAREAN SECTION     x5   DIAGNOSTIC LAPAROSCOPY     tubal preg   TUBAL LIGATION     Patient Active Problem List   Diagnosis Date Noted   Dysuria 10/28/2023   Urinary frequency 10/28/2023   Acute UTI 10/28/2023   Malignant neoplasm of upper-outer quadrant of left female breast (HCC) 10/30/2014   Breast cancer in situ 09/06/2014   Family history of malignant neoplasm of breast 09/06/2014   Breast cancer of upper-outer quadrant of left female breast (HCC) 08/07/2014    PCP: [facility]  REFERRING PROVIDER: Dr. 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    THERAPY DIAG:  Muscle weakness (generalized)  Physical deconditioning  Balance problem  Rationale for Evaluation and Treatment:  Rehabilitation  ONSET DATE: for at least a year   SUBJECTIVE:   SUBJECTIVE STATEMENT: Pt is having difficulty with her legs at night- having pain, stiffness, tingling sensation, tightness- she attributes it to a side effect for her chemotherapy- started this tx for ~ 1+ year  1 fall- 1 month ago when lost balance  Feeling some improvement since starting a new medication a little over 2 weeks ago for seizure-like activity (had an EEG- which shoed some increased activity on R side of her brain), having increased anxiety though from this medication  Typically worse with later in the day/at night and then if she is doing a lot of activity; sometimes it is affecting her sleep    Decreased standing/walking tolerance   She goes for chemo every 6 months; she feels the worst 3 days after her tx   PERTINENT HISTORY: Currently a patient at [facility] for 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 Yes 6-7/10  PRECAUTIONS: Fall  RED FLAGS: Active stage 4 breast cancer   WEIGHT BEARING RESTRICTIONS: No  FALLS:  Has patient fallen in last 6 months? Yes. Number of falls >1  LIVING ENVIRONMENT: Lives with: lives with their family Lives in: House/apartment Stairs: No Has following equipment at home: None  OCCUPATION: not working; was managing a Occupational hygienist truck stop; was working at  [employer] then stopped when she began her tx for cancer since March 2024  PLOF: Independent  PATIENT GOALS: pt would like to work on getting her legs moving better and also feeling less discomfort as she is trying to wind down at night  NEXT MD VISIT: every 2 months she has f/u   OBJECTIVE:  Note: Objective measures were completed at Evaluation unless otherwise noted.  DIAGNOSTIC FINDINGS: pt attends regularly scheduled appointments at [facility] for infusions, scans, and f/u with MD for cancer tx (see chart review)  PATIENT SURVEYS:  ABC scale to be given at visit #2  COGNITION: Overall  cognitive status: Within functional limits for tasks assessed     SENSATION: WFL  POSTURE: rounded shoulders  PALPATION: PROM hip/knees b/l WNL  LOWER EXTREMITY ROM:  Active ROM Right eval Left eval  Hip flexion    Hip extension    Hip abduction    Hip adduction    Hip internal rotation    Hip external rotation    Knee flexion    Knee extension    Ankle dorsiflexion    Ankle plantarflexion    Ankle inversion    Ankle eversion     (Blank rows = not tested)  LOWER EXTREMITY MMT:  MMT Right eval Left eval  Hip flexion 4+ 4  Hip extension    Hip abduction    Hip adduction    Hip internal rotation    Hip external rotation    Knee flexion 4 4-  Knee extension 4 4-  Ankle dorsiflexion    Ankle plantarflexion 4 4-  Ankle inversion    Ankle eversion     (Blank rows = not tested)   FUNCTIONAL TESTS:  SL stance: R x 10 sec, L x 5 seconds Tandem stance: increased trunk sway observed, able to stand x 10 seconds  5x STS 16 seconds  GAIT: Pt amb with decreased gait speed/decreased hip extension motion and posterior chain mm activation b/l                                                                                                                              TREATMENT DATE: 04/04/24    PATIENT EDUCATION:  Education details: PT POC/goals, benefits of PT Person educated: Patient Education method: Explanation Education comprehension: verbalized understanding and needs further education  HOME EXERCISE PROGRAM: To be formally initiated at visit #2  ASSESSMENT:  CLINICAL IMPRESSION: Patient is a 58 y.o. F who was seen today for physical therapy evaluation and treatment for decreased standing/walking tolerance, LE weakness, LE pain, deconditioning, decreased balance associated with her current cancer diagnosis and treatment plan.   OBJECTIVE IMPAIRMENTS: decreased activity tolerance, decreased balance, decreased endurance, decreased strength, impaired perceived  functional ability, and pain.   ACTIVITY LIMITATIONS: lifting, bending, standing, squatting, and locomotion level  PARTICIPATION LIMITATIONS: meal prep, cleaning, laundry, interpersonal relationship, shopping, community activity, and yard work  PERSONAL FACTORS: Past/current experiences and Time  since onset of injury/illness/exacerbation are also affecting patient's functional outcome.   REHAB POTENTIAL: Good  CLINICAL DECISION MAKING: Stable/uncomplicated  EVALUATION COMPLEXITY: Low   GOALS: Goals reviewed with patient? Yes  SHORT TERM GOALS: Target date: 04/13/24 Pt will be instructed on initial HEP for LE strengthening/balance training to work on >3x/week Baseline:at visit #2 Goal status: INITIAL   LONG TERM GOALS: Target date: 05/30/24  Pt will be able to perform 5x STS in <12 seconds, indicating reduced risk for falls Baseline: 16 seconds Goal status: INITIAL  2.  Pt will be able to complete 6 min walk test to further assess standing and walking tolerance to establish baseline walking tolerance at this stage Baseline: will administer at visit #2 Goal status: INITIAL  3.  Pt will be able to stand SLS x >20 seconds R and L on even and uneven surfaces to improve safety and reduce fall risk during amb Baseline: 5-10 sec Goal status: INITIAL   PLAN:  PT FREQUENCY: 1-2x/week  PT DURATION: 8 weeks  PLANNED INTERVENTIONS: 97110-Therapeutic exercises, 97530- Therapeutic activity, [id number]- Neuromuscular re-education, 97535- Self Care, and [id number]- Manual therapy  PLAN FOR NEXT SESSION: LE strength, balance training, 6 min walk  Vernell Reges, PT, DPT, OCS   Vernell FORBES Reges, PT 04/07/2024, 2:59 PM

## 2024-04-05 NOTE — Telephone Encounter (Signed)
 Called patient after receiving the following MyChart message:  Hey good morning is it possible to have a nurse or provider call me I have been experiencing some symptoms since starting keepra. Feeling very unease and severe anxiety. [phone]  Patient reports having different kinds of spells. Panic attacks, feeling weird. Sunday, she was trying to get her medicine together and it caused her to forget what medication she takes. She is feeling a sense of detachment from herself. Denies history of anxiety or previous panic attacks. Patient is tearful and she is very scared about what this medication is doing to her body.  She is taking keppra 500 mg twice a day, she started it around 8/22 or 8/23.  Will forward urgently to Dr. Thurman.

## 2024-04-05 NOTE — Telephone Encounter (Signed)
 Called patient:   She reports that she had 2 episodes of anxiety and forgetfulness and headaches. Started keppra 500 mg BID on August 22.   On 8/31, she was confused about her medications, and she then had a panic attack. Her daughter helped with her medication later.  She then hard to sleep.   Today, she felt generally weird and had another panic attack. The episode resolved in 20 minutes after rest.   Patient expressed extreme concerns for continuing keppra. No history of anxiety or other mood disorders. Given mood issues, it is reasonable to transition to vimpat 50 mg BID (rx sent). I spoke to the patient that the most common side effects are dizziness and drowsiness.

## 2024-04-09 ENCOUNTER — Ambulatory Visit

## 2024-04-09 NOTE — Therapy (Signed)
 Pt arrived for appointment and then did not stay for scheduled appointment today.  No treatment occurred.  No charges billed to insurance for today's visit.  Vernell Reges, PT, DPT, OCS

## 2024-04-09 NOTE — Addendum Note (Signed)
 Addended by: Megan Hayduk E on: 04/09/2024 08:01 AM   Modules accepted: Orders

## 2024-04-11 ENCOUNTER — Encounter

## 2024-04-13 ENCOUNTER — Encounter

## 2024-04-16 ENCOUNTER — Ambulatory Visit

## 2024-04-16 DIAGNOSIS — M6281 Muscle weakness (generalized): Secondary | ICD-10-CM

## 2024-04-16 DIAGNOSIS — R2689 Other abnormalities of gait and mobility: Secondary | ICD-10-CM

## 2024-04-16 DIAGNOSIS — R5381 Other malaise: Secondary | ICD-10-CM

## 2024-04-16 NOTE — Therapy (Signed)
 OUTPATIENT PHYSICAL THERAPY LOWER EXTREMITY EVALUATION 04/04/24   Patient Name: Brittney Horton MRN: [id number] DOB:[birth year], 58 y.o., female Today's Date: 04/17/2024  END OF SESSION:  PT End of Session - 04/16/24 0929     Visit Number 2    Number of Visits 17    Date for PT Re-Evaluation 05/30/24    Authorization Type 8 weeks (2x/week) requested    PT Start Time 1115    PT Stop Time 1200    PT Time Calculation (min) 45 min    Activity Tolerance Patient tolerated treatment well    Behavior During Therapy [facility] for tasks assessed/performed           Past Medical History:  Diagnosis Date   Anxiety    Breast cancer (HCC) 08/01/14 bx   left breast LOQ   Headache    Hypertension    Personal history of radiation therapy    S/P radiation therapy 10/10/14-11/28/14   left breast 60.4Gy   Past Surgical History:  Procedure Laterality Date   BREAST BIOPSY Left 07/2014   BREAST BIOPSY Left 06/2014   BREAST LUMPECTOMY Left 09/06/2014   BREAST LUMPECTOMY WITH AXILLARY LYMPH NODE BIOPSY Left 09/06/14   ductal ca in situ, high grade   CESAREAN SECTION     x5   DIAGNOSTIC LAPAROSCOPY     tubal preg   TUBAL LIGATION     Patient Active Problem List   Diagnosis Date Noted   Dysuria 10/28/2023   Urinary frequency 10/28/2023   Acute UTI 10/28/2023   Malignant neoplasm of upper-outer quadrant of left female breast (HCC) 10/30/2014   Breast cancer in situ 09/06/2014   Family history of malignant neoplasm of breast 09/06/2014   Breast cancer of upper-outer quadrant of left female breast (HCC) 08/07/2014    PCP: [facility]  REFERRING PROVIDER: Dr. 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    THERAPY DIAG:  Muscle weakness (generalized)  Physical deconditioning  Balance problem  Rationale for Evaluation and Treatment:  Rehabilitation  ONSET DATE: for at least a year   SUBJECTIVE:   SUBJECTIVE STATEMENT: Pt is having difficulty with her legs at night- having pain, stiffness, tingling sensation, tightness- she attributes it to a side effect for her chemotherapy- started this tx for ~ 1+ year  1 fall- 1 month ago when lost balance  Feeling some improvement since starting a new medication a little over 2 weeks ago for seizure-like activity (had an EEG- which shoed some increased activity on R side of her brain), having increased anxiety though from this medication  Typically worse with later in the day/at night and then if she is doing a lot of activity; sometimes it is affecting her sleep    Decreased standing/walking tolerance   She goes for chemo every 6 months; she feels the worst 3 days after her tx   PERTINENT HISTORY: Currently a patient at [facility] for 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 Yes 6-7/10  PRECAUTIONS: Fall  RED FLAGS: Active stage 4 breast cancer   WEIGHT BEARING RESTRICTIONS: No  FALLS:  Has patient fallen in last 6 months? Yes. 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  DIAGNOSTIC FINDINGS: pt attends regularly scheduled appointments at [facility] for infusions, scans, and f/u with MD for cancer tx (see chart review)  PATIENT SURVEYS:  ABC scale to be given at visit #2  COGNITION: Overall  cognitive status: Within functional limits for tasks assessed     SENSATION: WFL  POSTURE: rounded shoulders  PALPATION: PROM hip/knees b/l WNL  LOWER EXTREMITY ROM:  Active ROM Right eval Left eval  Hip flexion    Hip extension    Hip abduction    Hip adduction    Hip internal rotation    Hip external rotation    Knee flexion    Knee extension    Ankle dorsiflexion    Ankle plantarflexion    Ankle inversion    Ankle eversion     (Blank rows = not tested)  LOWER EXTREMITY MMT:  MMT Right eval Left eval  Hip flexion 4+ 4  Hip extension    Hip abduction    Hip adduction    Hip internal rotation    Hip external rotation    Knee flexion 4 4-  Knee extension 4 4-  Ankle dorsiflexion    Ankle plantarflexion 4 4-  Ankle inversion    Ankle eversion     (Blank rows = not tested)   FUNCTIONAL TESTS:  SL stance: R x 10 sec, L x 5 seconds Tandem stance: increased trunk sway observed, able to stand x 10 seconds  5x STS 16 seconds  GAIT: Pt amb with decreased gait speed/decreased hip extension motion and posterior chain mm activation b/l                                                                                                                              TREATMENT DATE: 04/16/24  Subjective: took a lot of rest this weekend beause she had chemo last Thursday and this is very fatiguing; took her off the one medicine bc she was having a lot of panic attacks and started a new medication.  Legs are feeling better since starting new medication.  No falls since last session.  Some days sleep is good and some days its hard because of her leg pain.      Objective: Pain: currently 0-1/10  Therapeutic Exercise: Supine heel slide: x 10 ea Hooklying LTR x 10 ea SKTC x 5 ea Supine ankle DF/PF x 10 ea Bridges x 10 SLR x 10 ea Supine hip abd/add x 10 ea   Therapeutic Activities: Sit to stand x 5 Discussed benefits of performing HEP  before sleep for sx management,  benefits of exercise for sx management HEP instruction: Access Code: [email or web address] URL: [email or web address] Date: 04/16/2024 Prepared by: Vernell Reges  Exercises - Supine Heel Slide  - 1 x daily - 7 x weekly - 2 sets - 5 reps - Supine Lower Trunk Rotation  - 1 x daily - 7 x weekly - 2 sets - 5 reps - Hooklying Single Knee to Chest Stretch  - 1 x daily - 7 x weekly - 1 sets - 5 reps - Supine Bridge  - 1 x daily - 7 x weekly - 2 sets - 5 reps - Supine Ankle Pumps  - 1 x daily - 7 x weekly - 2 sets - 10 reps  PATIENT EDUCATION:  Education details: PT POC/goals, benefits of PT, exercise technique/pacing/benefits Person educated: Patient Education method: Explanation Education comprehension: verbalized understanding and needs further education  HOME EXERCISE PROGRAM: To be formally initiated at visit #2- see above  ASSESSMENT:  CLINICAL IMPRESSION: Patient is a 58 y.o. F who was seen today for physical therapy treatment for decreased standing/walking tolerance, LE weakness, LE pain, deconditioning, decreased balance associated with her current cancer diagnosis and treatment plan.  Pt tires easily with amount of exercise today.  SLR on L was very difficult.  Initiated a gentle HEP for evenings before sleep to help facilitate sx management and improved sleep quality.     OBJECTIVE IMPAIRMENTS: decreased activity tolerance, decreased balance, decreased endurance, decreased strength, impaired perceived functional ability, and pain.   ACTIVITY LIMITATIONS: lifting, bending, standing, squatting, and locomotion level  PARTICIPATION LIMITATIONS: meal prep, cleaning, laundry, interpersonal relationship, shopping, community activity, and yard work  PERSONAL FACTORS: Past/current experiences and Time since onset of injury/illness/exacerbation are also affecting patient's functional outcome.   REHAB POTENTIAL: Good  CLINICAL DECISION MAKING: Stable/uncomplicated  EVALUATION  COMPLEXITY: Low   GOALS: Goals reviewed with patient? Yes  SHORT TERM GOALS: Target date: 04/13/24 Pt will be instructed on initial HEP for LE strengthening/balance training to work on >3x/week Baseline:at visit #2 Goal status: INITIAL   LONG TERM GOALS: Target date: 05/30/24  Pt will be able to perform 5x STS in <12 seconds, indicating reduced risk for falls Baseline: 16 seconds Goal status: INITIAL  2.  Pt will be able to complete 6 min walk test to further assess standing and walking tolerance to establish baseline walking tolerance at this stage Baseline: will administer at visit #2 Goal status: INITIAL  3.  Pt will be able to stand SLS x >20 seconds R and L on even and uneven surfaces to improve safety and reduce fall risk during amb Baseline: 5-10 sec Goal status: INITIAL   PLAN:  PT FREQUENCY: 1-2x/week  PT DURATION: 8 weeks  PLANNED INTERVENTIONS: 97110-Therapeutic exercises, 97530- Therapeutic activity, [id number]- Neuromuscular re-education, 97535- Self Care, and [id number]- Manual therapy  PLAN FOR NEXT SESSION: LE strength, balance training, 6 min walk at next visit  Vernell Reges, PT, DPT, OCS   Eman Morimoto E Anasha Perfecto, PT 04/17/2024, 9:30 AM

## 2024-04-18 ENCOUNTER — Encounter

## 2024-04-23 ENCOUNTER — Ambulatory Visit

## 2024-04-23 DIAGNOSIS — M6281 Muscle weakness (generalized): Secondary | ICD-10-CM

## 2024-04-23 DIAGNOSIS — R2689 Other abnormalities of gait and mobility: Secondary | ICD-10-CM

## 2024-04-23 DIAGNOSIS — R5381 Other malaise: Secondary | ICD-10-CM

## 2024-04-23 NOTE — Therapy (Signed)
 OUTPATIENT PHYSICAL THERAPY LOWER EXTREMITY EVALUATION 04/04/24   Patient Name: Brittney Horton MRN: [id number] DOB:1966-07-15, 58 y.o., female Today's Date: 04/23/2024  END OF SESSION:  PT End of Session - 04/23/24 1122     Visit Number 3    Number of Visits 17    Date for Recertification  05/30/24    Authorization Type 8 weeks (2x/week) requested    PT Start Time 1120    PT Stop Time 1205    PT Time Calculation (min) 45 min    Activity Tolerance Patient tolerated treatment well    Behavior During Therapy [facility] for tasks assessed/performed           Past Medical History:  Diagnosis Date   Anxiety    Breast cancer (HCC) 08/01/14 bx   left breast LOQ   Headache    Hypertension    Personal history of radiation therapy    S/P radiation therapy 10/10/14-11/28/14   left breast 60.4Gy   Past Surgical History:  Procedure Laterality Date   BREAST BIOPSY Left 07/2014   BREAST BIOPSY Left 06/2014   BREAST LUMPECTOMY Left 09/06/2014   BREAST LUMPECTOMY WITH AXILLARY LYMPH NODE BIOPSY Left 09/06/14   ductal ca in situ, high grade   CESAREAN SECTION     x5   DIAGNOSTIC LAPAROSCOPY     tubal preg   TUBAL LIGATION     Patient Active Problem List   Diagnosis Date Noted   Dysuria 10/28/2023   Urinary frequency 10/28/2023   Acute UTI 10/28/2023   Malignant neoplasm of upper-outer quadrant of left female breast (HCC) 10/30/2014   Breast cancer in situ 09/06/2014   Family history of malignant neoplasm of breast 09/06/2014   Breast cancer of upper-outer quadrant of left female breast (HCC) 08/07/2014    PCP: [facility]  REFERRING PROVIDER: Dr. 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    THERAPY DIAG:  Muscle weakness (generalized)  Physical deconditioning  Balance problem  Rationale for Evaluation and Treatment:  Rehabilitation  ONSET DATE: for at least a year   SUBJECTIVE:   SUBJECTIVE STATEMENT: Pt is having difficulty with her legs at night- having pain, stiffness, tingling sensation, tightness- she attributes it to a side effect for her chemotherapy- started this tx for ~ 1+ year  1 fall- 1 month ago when lost balance  Feeling some improvement since starting a new medication a little over 2 weeks ago for seizure-like activity (had an EEG- which shoed some increased activity on R side of her brain), having increased anxiety though from this medication  Typically worse with later in the day/at night and then if she is doing a lot of activity; sometimes it is affecting her sleep    Decreased standing/walking tolerance   She goes for chemo every 6 months; she feels the worst 3 days after her tx   PERTINENT HISTORY: Currently a patient at [facility] for 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 Yes 6-7/10  PRECAUTIONS: Fall  RED FLAGS: Active stage 4 breast cancer   WEIGHT BEARING RESTRICTIONS: No  FALLS:  Has patient fallen in last 6 months? Yes. 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  DIAGNOSTIC FINDINGS: pt attends regularly scheduled appointments at [facility] for infusions, scans, and f/u with MD for cancer tx (see chart review)  PATIENT SURVEYS:  ABC scale to be given at visit #2  COGNITION: Overall  cognitive status: Within functional limits for tasks assessed     SENSATION: WFL  POSTURE: rounded shoulders  PALPATION: PROM hip/knees b/l WNL  LOWER EXTREMITY ROM:  Active ROM Right eval Left eval  Hip flexion    Hip extension    Hip abduction    Hip adduction    Hip internal rotation    Hip external rotation    Knee flexion    Knee extension    Ankle dorsiflexion    Ankle plantarflexion    Ankle inversion    Ankle eversion     (Blank rows = not tested)  LOWER EXTREMITY MMT:  MMT Right eval Left eval  Hip flexion 4+ 4  Hip extension    Hip abduction    Hip adduction    Hip internal rotation    Hip external rotation    Knee flexion 4 4-  Knee extension 4 4-  Ankle dorsiflexion    Ankle plantarflexion 4 4-  Ankle inversion    Ankle eversion     (Blank rows = not tested)   FUNCTIONAL TESTS:  SL stance: R x 10 sec, L x 5 seconds Tandem stance: increased trunk sway observed, able to stand x 10 seconds  5x STS 16 seconds  GAIT: Pt amb with decreased gait speed/decreased hip extension motion and posterior chain mm activation b/l                                                                                                                              TREATMENT DATE: 04/23/24  Subjective: PT did her HEP 2x since last session.  It went well.  Legs are feeling better since starting new medication.  No falls since last session.  Some days sleep is good and some days its hard because of her leg pain.      Objective: Pain: currently 0-1/10  Therapeutic Exercise: Supine heel slide: x 10 ea Hooklying LTR x 10 ea SKTC x 5 ea Supine ankle DF/PF x 10 ea- not today Bridges x 10- not today SLR x 10 ea Supine hip abd/add x 10 ea- not today Standing heel raises x 15 Standing toe raises x 15 Seated knee extension: 4# x8, x6 ea LE Seated marches: 4 # x8, x6 ea LE   Therapeutic Activities: Sit to stand x  5, x10 Standing hip abd x 10 ea LE, 2 sets, PT cues for  upright trunk posture Discussed benefits of performing HEP before sleep for sx management, benefits of exercise for sx management  Neuro re-ed:  In parallel bars Airex: feet together 3 rounds, head turns R/L x10 ea, up/down x10 ea; SLS R/L x3 rounds, repeated 3 rounds again (L more challenging than R)   HEP instruction: Access Code: [email or web address] URL: [email or web address] Date: 04/16/2024 Prepared by: Vernell Reges  Exercises - Supine Heel Slide  - 1 x daily - 7 x weekly - 2 sets - 5 reps - Supine Lower Trunk Rotation  - 1 x daily - 7 x weekly - 2 sets - 5 reps - Hooklying Single Knee to Chest Stretch  - 1 x daily - 7 x weekly - 1 sets - 5 reps - Supine Bridge  - 1 x daily - 7 x weekly - 2 sets - 5 reps - Supine Ankle Pumps  - 1 x daily - 7 x weekly - 2 sets - 10 reps  PATIENT EDUCATION:  Education details: PT POC/goals, benefits of PT, exercise technique/pacing/benefits Person educated: Patient Education method: Explanation Education comprehension: verbalized understanding and needs further education  HOME EXERCISE PROGRAM: To be formally initiated at visit #2- see above  ASSESSMENT:  CLINICAL IMPRESSION: Patient is a 58 y.o. F who was seen today for physical therapy treatment for decreased standing/walking tolerance, LE weakness, LE pain, deconditioning, decreased balance associated with her current cancer diagnosis and treatment plan.  Pt tires easily with amount of exercise today.  SLR on L remains very difficult.  She was more challenged with SLS on L compared to R and more challenged with LE strengthening using ankle weights on L.  Should benefit from continuing skilled PT to address impairments and facilitate improved QOL.   OBJECTIVE IMPAIRMENTS: decreased activity tolerance, decreased balance, decreased endurance, decreased strength, impaired perceived functional ability, and pain.   ACTIVITY LIMITATIONS: lifting, bending, standing, squatting, and locomotion  level  PARTICIPATION LIMITATIONS: meal prep, cleaning, laundry, interpersonal relationship, shopping, community activity, and yard work  PERSONAL FACTORS: Past/current experiences and Time since onset of injury/illness/exacerbation are also affecting patient's functional outcome.   REHAB POTENTIAL: Good  CLINICAL DECISION MAKING: Stable/uncomplicated  EVALUATION COMPLEXITY: Low   GOALS: Goals reviewed with patient? Yes  SHORT TERM GOALS: Target date: 04/13/24 Pt will be instructed on initial HEP for LE strengthening/balance training to work on >3x/week Baseline:at visit #2 Goal status: INITIAL   LONG TERM GOALS: Target date: 05/30/24  Pt will be able to perform 5x STS in <12 seconds, indicating reduced risk for falls Baseline: 16 seconds Goal status: INITIAL  2.  Pt will be able to complete 6 min walk test to further assess standing and walking tolerance to establish baseline walking tolerance at this stage Baseline: will administer at visit #2 Goal status: INITIAL  3.  Pt will be able to stand SLS x >20 seconds R and L on even and uneven surfaces to improve safety and reduce fall risk during amb Baseline: 5-10 sec Goal status: INITIAL   PLAN:  PT FREQUENCY: 1-2x/week  PT DURATION: 8 weeks  PLANNED INTERVENTIONS: 97110-Therapeutic exercises, 97530- Therapeutic activity, [id number]- Neuromuscular re-education, 97535- Self Care, and 02859- Manual therapy  PLAN FOR NEXT SESSION: LE strength, balance training, 6 min walk at next visit  Vernell Reges, PT, DPT, OCS   Vernell FORBES Reges, PT 04/23/2024, 12:27 PM

## 2024-04-25 ENCOUNTER — Encounter

## 2024-05-01 ENCOUNTER — Encounter

## 2024-05-02 ENCOUNTER — Ambulatory Visit: Attending: Internal Medicine

## 2024-05-02 DIAGNOSIS — R5381 Other malaise: Secondary | ICD-10-CM | POA: Insufficient documentation

## 2024-05-02 DIAGNOSIS — M6281 Muscle weakness (generalized): Secondary | ICD-10-CM | POA: Insufficient documentation

## 2024-05-02 DIAGNOSIS — R2689 Other abnormalities of gait and mobility: Secondary | ICD-10-CM | POA: Insufficient documentation

## 2024-05-02 NOTE — Therapy (Signed)
 OUTPATIENT PHYSICAL THERAPY LOWER EXTREMITY TREATMENT   Patient Name: Brittney Horton MRN: [id number] DOB:1966-06-09, 58 y.o., female Today's Date: 05/02/2024  END OF SESSION:  PT End of Session - 05/02/24 1116     Visit Number 4    Number of Visits 17    Date for Recertification  05/30/24    Authorization Type 8 weeks (2x/week) requested    PT Start Time 1115    PT Stop Time 1200    PT Time Calculation (min) 45 min    Activity Tolerance Patient tolerated treatment well    Behavior During Therapy [facility] for tasks assessed/performed           Past Medical History:  Diagnosis Date   Anxiety    Breast cancer (HCC) 08/01/14 bx   left breast LOQ   Headache    Hypertension    Personal history of radiation therapy    S/P radiation therapy 10/10/14-11/28/14   left breast 60.4Gy   Past Surgical History:  Procedure Laterality Date   BREAST BIOPSY Left 07/2014   BREAST BIOPSY Left 06/2014   BREAST LUMPECTOMY Left 09/06/2014   BREAST LUMPECTOMY WITH AXILLARY LYMPH NODE BIOPSY Left 09/06/14   ductal ca in situ, high grade   CESAREAN SECTION     x5   DIAGNOSTIC LAPAROSCOPY     tubal preg   TUBAL LIGATION     Patient Active Problem List   Diagnosis Date Noted   Dysuria 10/28/2023   Urinary frequency 10/28/2023   Acute UTI 10/28/2023   Malignant neoplasm of upper-outer quadrant of left female breast (HCC) 10/30/2014   Breast cancer in situ 09/06/2014   Family history of malignant neoplasm of breast 09/06/2014   Breast cancer of upper-outer quadrant of left female breast (HCC) 08/07/2014    PCP: [facility]  REFERRING PROVIDER: Dr. 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    THERAPY DIAG:  Muscle weakness (generalized)  Physical deconditioning  Balance problem  Rationale for Evaluation and Treatment:  Rehabilitation  ONSET DATE: for at least a year   SUBJECTIVE:   SUBJECTIVE STATEMENT: Pt is having difficulty with her legs at night- having pain, stiffness, tingling sensation, tightness- she attributes it to a side effect for her chemotherapy- started this tx for ~ 1+ year  1 fall- 1 month ago when lost balance  Feeling some improvement since starting a new medication a little over 2 weeks ago for seizure-like activity (had an EEG- which shoed some increased activity on R side of her brain), having increased anxiety though from this medication  Typically worse with later in the day/at night and then if she is doing a lot of activity; sometimes it is affecting her sleep    Decreased standing/walking tolerance   She goes for chemo every 6 months; she feels the worst 3 days after her tx   PERTINENT HISTORY: Currently a patient at [facility] for 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 Yes 6-7/10  PRECAUTIONS: Fall  RED FLAGS: Active stage 4 breast cancer   WEIGHT BEARING RESTRICTIONS: No  FALLS:  Has patient fallen in last 6 months? Yes. Number of falls >1  LIVING ENVIRONMENT: Lives with: lives with their family Lives in: House/apartment Stairs: No Has following equipment at home: None  OCCUPATION: not working; was managing a Occupational hygienist truck stop; was working at  [employer] then stopped when she began her tx for cancer since March 2024  PLOF: Independent  PATIENT GOALS: pt would like to work on getting her legs moving better and also feeling less discomfort as she is trying to wind down at night  NEXT MD VISIT: every 2 months she has f/u   OBJECTIVE:  Note: Objective measures were completed at Evaluation unless otherwise noted.  DIAGNOSTIC FINDINGS: pt attends regularly scheduled appointments at [facility] for infusions, scans, and f/u with MD for cancer tx (see chart review)  PATIENT SURVEYS:  ABC scale to be given at visit #2  COGNITION: Overall  cognitive status: Within functional limits for tasks assessed     SENSATION: WFL  POSTURE: rounded shoulders  PALPATION: PROM hip/knees b/l WNL  LOWER EXTREMITY ROM:  Active ROM Right eval Left eval  Hip flexion    Hip extension    Hip abduction    Hip adduction    Hip internal rotation    Hip external rotation    Knee flexion    Knee extension    Ankle dorsiflexion    Ankle plantarflexion    Ankle inversion    Ankle eversion     (Blank rows = not tested)  LOWER EXTREMITY MMT:  MMT Right eval Left eval  Hip flexion 4+ 4  Hip extension    Hip abduction    Hip adduction    Hip internal rotation    Hip external rotation    Knee flexion 4 4-  Knee extension 4 4-  Ankle dorsiflexion    Ankle plantarflexion 4 4-  Ankle inversion    Ankle eversion     (Blank rows = not tested)   FUNCTIONAL TESTS:  SL stance: R x 10 sec, L x 5 seconds Tandem stance: increased trunk sway observed, able to stand x 10 seconds  5x STS 16 seconds  GAIT: Pt amb with decreased gait speed/decreased hip extension motion and posterior chain mm activation b/l                                                                                                                              TREATMENT DATE: 05/02/24 Subjective: PT reports she is feeling pretty good upon arrival.  Had a few consecutive days of fatigue/feeling unwell after last chemo tx.  Worked on her HEP when she could.  No falls since last session.  Some days sleep is good and some days its hard because of her leg pain.  Feels like her balance is getting a little better.     Objective: Pain: currently 0-1/10  Therapeutic Exercise: Supine heel slide: x 10 ea Hooklying LTR x 10 ea SKTC x 5 ea Supine ankle DF/PF x 10 ea- not today Bridges x 10- not today SLR x 10 ea Supine hip abd/add x 10 ea- sitting today, ball and blue TB Standing heel raises x 15 Standing toe raises x 15 Seated knee extension: 5#  x8, x6 ea LE Seated  marches: 5# x8, x6 ea LE   Therapeutic Activities: Sit to stand x 5, x10 Standing hip abd x 10 ea LE, 2 sets, PT cues for upright trunk posture Discussed benefits of performing HEP before sleep for sx management, benefits of exercise for sx management  Neuro re-ed:  In parallel bars Airex: feet together 3 rounds, head turns R/L x10 ea, up/down x10 ea; SLS R/L x3 rounds, repeated 3 rounds again (L more challenging than R) Tandem stance: R and L 3x ea  SLS R and L 5x ea: (L 10 and R 20 sec now)   HEP instruction: Access Code: [email or web address] URL: [email or web address] Date: 04/16/2024 Prepared by: Vernell Reges  Exercises - Supine Heel Slide  - 1 x daily - 7 x weekly - 2 sets - 5 reps - Supine Lower Trunk Rotation  - 1 x daily - 7 x weekly - 2 sets - 5 reps - Hooklying Single Knee to Chest Stretch  - 1 x daily - 7 x weekly - 1 sets - 5 reps - Supine Bridge  - 1 x daily - 7 x weekly - 2 sets - 5 reps - Supine Ankle Pumps  - 1 x daily - 7 x weekly - 2 sets - 10 reps  PATIENT EDUCATION:  Education details: PT POC/goals, benefits of PT, exercise technique/pacing/benefits Person educated: Patient Education method: Explanation Education comprehension: verbalized understanding and needs further education  HOME EXERCISE PROGRAM: To be formally initiated at visit #2- see above  ASSESSMENT:  CLINICAL IMPRESSION: Patient is a 58 y.o. F who was seen today for physical therapy treatment for decreased standing/walking tolerance, LE weakness, LE pain, deconditioning, decreased balance associated with her current cancer diagnosis and treatment plan.  Pt is motivated to participate in tx and reports adherence with HEP since last session.  Her SL balance is improving and overall able to participate in tx with fewer rest breaks today.  Should benefit from continuing skilled PT to address impairments, reduce fall risk, and facilitate improved QOL.   OBJECTIVE IMPAIRMENTS: decreased  activity tolerance, decreased balance, decreased endurance, decreased strength, impaired perceived functional ability, and pain.   ACTIVITY LIMITATIONS: lifting, bending, standing, squatting, and locomotion level  PARTICIPATION LIMITATIONS: meal prep, cleaning, laundry, interpersonal relationship, shopping, community activity, and yard work  PERSONAL FACTORS: Past/current experiences and Time since onset of injury/illness/exacerbation are also affecting patient's functional outcome.   REHAB POTENTIAL: Good  CLINICAL DECISION MAKING: Stable/uncomplicated  EVALUATION COMPLEXITY: Low   GOALS: Goals reviewed with patient? Yes  SHORT TERM GOALS: Target date: 04/13/24 Pt will be instructed on initial HEP for LE strengthening/balance training to work on >3x/week Baseline:at visit #2 Goal status: INITIAL   LONG TERM GOALS: Target date: 05/30/24  Pt will be able to perform 5x STS in <12 seconds, indicating reduced risk for falls Baseline: 16 seconds Goal status: INITIAL  2.  Pt will be able to complete 6 min walk test to further assess standing and walking tolerance to establish baseline walking tolerance at this stage Baseline: will administer at visit #2 Goal status: INITIAL  3.  Pt will be able to stand SLS x >20 seconds R and L on even and uneven surfaces to improve safety and reduce fall risk during amb Baseline: 5-10 sec Goal status: INITIAL   PLAN:  PT FREQUENCY: 1-2x/week  PT DURATION: 8 weeks  PLANNED INTERVENTIONS: 97110-Therapeutic exercises, 97530- Therapeutic activity, [id number]- Neuromuscular re-education, 97535- Self Care, and [id number]- Manual therapy  PLAN FOR NEXT SESSION: LE strength, balance training, 6 min walk at next visit, recheck 5x STS  Vernell Reges, PT, DPT, OCS   Lavonia Eager E Misheel Gowans, PT 05/02/2024, 11:17 AM

## 2024-05-07 ENCOUNTER — Ambulatory Visit

## 2024-05-07 DIAGNOSIS — M6281 Muscle weakness (generalized): Secondary | ICD-10-CM

## 2024-05-07 DIAGNOSIS — R2689 Other abnormalities of gait and mobility: Secondary | ICD-10-CM

## 2024-05-07 DIAGNOSIS — R5381 Other malaise: Secondary | ICD-10-CM

## 2024-05-07 NOTE — Therapy (Signed)
 OUTPATIENT PHYSICAL THERAPY LOWER EXTREMITY TREATMENT   Patient Name: Brittney Horton MRN: [id number] DOB:12/24/1965, 58 y.o., female Today's Date: 05/07/2024  END OF SESSION:  PT End of Session - 05/07/24 1032     Visit Number 5    Number of Visits 17    Date for Recertification  05/30/24    Authorization Type 8 weeks (2x/week) requested    PT Start Time 1030    PT Stop Time 1115    PT Time Calculation (min) 45 min    Activity Tolerance Patient tolerated treatment well    Behavior During Therapy [facility] for tasks assessed/performed           Past Medical History:  Diagnosis Date   Anxiety    Breast cancer (HCC) 08/01/14 bx   left breast LOQ   Headache    Hypertension    Personal history of radiation therapy    S/P radiation therapy 10/10/14-11/28/14   left breast 60.4Gy   Past Surgical History:  Procedure Laterality Date   BREAST BIOPSY Left 07/2014   BREAST BIOPSY Left 06/2014   BREAST LUMPECTOMY Left 09/06/2014   BREAST LUMPECTOMY WITH AXILLARY LYMPH NODE BIOPSY Left 09/06/14   ductal ca in situ, high grade   CESAREAN SECTION     x5   DIAGNOSTIC LAPAROSCOPY     tubal preg   TUBAL LIGATION     Patient Active Problem List   Diagnosis Date Noted   Dysuria 10/28/2023   Urinary frequency 10/28/2023   Acute UTI 10/28/2023   Malignant neoplasm of upper-outer quadrant of left female breast (HCC) 10/30/2014   Breast cancer in situ 09/06/2014   Family history of malignant neoplasm of breast 09/06/2014   Breast cancer of upper-outer quadrant of left female breast (HCC) 08/07/2014    PCP: Brittney Horton  REFERRING PROVIDER: Dr. 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    THERAPY DIAG:  Muscle weakness (generalized)  Physical deconditioning  Balance problem  Rationale for Evaluation and Treatment:  Rehabilitation  ONSET DATE: for at least a year   SUBJECTIVE:   SUBJECTIVE STATEMENT: Pt is having difficulty with her legs at night- having pain, stiffness, tingling sensation, tightness- she attributes it to a side effect for her chemotherapy- started this tx for ~ 1+ year  1 fall- 1 month ago when lost balance  Feeling some improvement since starting a new medication a little over 2 weeks ago for seizure-like activity (had an EEG- which shoed some increased activity on R side of her brain), having increased anxiety though from this medication  Typically worse with later in the day/at night and then if she is doing a lot of activity; sometimes it is affecting her sleep    Decreased standing/walking tolerance   She goes for chemo every 6 months; she feels the worst 3 days after her tx   PERTINENT HISTORY: Currently a patient at [facility] for 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 Yes 6-7/10  PRECAUTIONS: Fall  RED FLAGS: Active stage 4 breast cancer   WEIGHT BEARING RESTRICTIONS: No  FALLS:  Has patient fallen in last 6 months? Yes. Number of falls >1  LIVING ENVIRONMENT: Lives with: lives with their family Lives in: House/apartment Stairs: No Has following equipment at home: None  OCCUPATION: not working; was managing a Occupational hygienist truck stop; was working at  [employer] then stopped when she began her tx for cancer since March 2024  PLOF: Independent  PATIENT GOALS: pt would like to work on getting her legs moving better and also feeling less discomfort as she is trying to wind down at night  NEXT MD VISIT: every 2 months she has f/u   OBJECTIVE:  Note: Objective measures were completed at Evaluation unless otherwise noted.  DIAGNOSTIC FINDINGS: pt attends regularly scheduled appointments at [facility] for infusions, scans, and f/u with MD for cancer tx (see chart review)  PATIENT SURVEYS:  ABC scale to be given at visit #2  COGNITION: Overall  cognitive status: Within functional limits for tasks assessed     SENSATION: WFL  POSTURE: rounded shoulders  PALPATION: PROM hip/knees b/l WNL  LOWER EXTREMITY ROM:  Active ROM Right eval Left eval  Hip flexion    Hip extension    Hip abduction    Hip adduction    Hip internal rotation    Hip external rotation    Knee flexion    Knee extension    Ankle dorsiflexion    Ankle plantarflexion    Ankle inversion    Ankle eversion     (Blank rows = not tested)  LOWER EXTREMITY MMT:  MMT Right eval Left eval  Hip flexion 4+ 4  Hip extension    Hip abduction    Hip adduction    Hip internal rotation    Hip external rotation    Knee flexion 4 4-  Knee extension 4 4-  Ankle dorsiflexion    Ankle plantarflexion 4 4-  Ankle inversion    Ankle eversion     (Blank rows = not tested)   FUNCTIONAL TESTS:  SL stance: R x 10 sec, L x 5 seconds Tandem stance: increased trunk sway observed, able to stand x 10 seconds  5x STS 16 seconds  GAIT: Pt amb with decreased gait speed/decreased hip extension motion and posterior chain mm activation b/l                                                                                                                              TREATMENT DATE: 05/07/24 Subjective: PT reports she continues to work on her HEP.  She did a little grocery shopping at [employer] this weekend, no other strenuous activities.    Objective: Pain: currently 0-1/10 5X STS: 12 seconds today SLS: 13-17 sec R/L LE  Therapeutic Exercise: Supine heel slide: x 10 ea Hooklying LTR x 10 ea SKTC x 5 ea Supine ankle DF/PF x 10 ea- not today Bridges x 10- not today SLR x 10 ea Supine hip abd/add x 20 ea- sitting today, ball and blue TB Standing heel raises x 10, 2 sets Standing toe raises x10, 2 sets Seated knee extension: 5# x10, x8 ea LE Seated marches: 5# x10, x8 ea LE   Therapeutic Activities: Sit to stand x 5, x5 x5 Standing hip abd x  10 ea LE, 2 sets, PT  cues for upright trunk posture Standing mini squat x10 Discussed benefits of performing HEP before sleep for sx management, benefits of exercise for sx management  Neuro re-ed:  In parallel bars Airex: feet together 3 rounds, head turns R/L x10 ea, up/down x10 ea; SLS R/L x3 rounds, repeated 3 rounds again (L more challenging than R)- not today Tandem stance: R and L 3x ea  SLS R and L 5x ea: (L 10 and R 20 sec now)   HEP instruction: Access Code: [email or web address] URL: [email or web address] Date: 04/16/2024 Prepared by: Vernell Reges  Exercises - Supine Heel Slide  - 1 x daily - 7 x weekly - 2 sets - 5 reps - Supine Lower Trunk Rotation  - 1 x daily - 7 x weekly - 2 sets - 5 reps - Hooklying Single Knee to Chest Stretch  - 1 x daily - 7 x weekly - 1 sets - 5 reps - Supine Bridge  - 1 x daily - 7 x weekly - 2 sets - 5 reps - Supine Ankle Pumps  - 1 x daily - 7 x weekly - 2 sets - 10 reps  PATIENT EDUCATION:  Education details: PT POC/goals, benefits of PT, exercise technique/pacing/benefits Person educated: Patient Education method: Explanation Education comprehension: verbalized understanding and needs further education  HOME EXERCISE PROGRAM: To be formally initiated at visit #2- see above  ASSESSMENT:  CLINICAL IMPRESSION: Patient is a 58 y.o. F who was seen today for physical therapy treatment for decreased standing/walking tolerance, LE weakness, LE pain, deconditioning, decreased balance associated with her current cancer diagnosis and treatment plan.  Pt is motivated to participate in tx and reports adherence with HEP since last session.  Able to tolerate progression of LE strengthening with increased resistance/reps today.  Should benefit from continuing skilled PT to address impairments, reduce fall risk, and facilitate improved QOL.   OBJECTIVE IMPAIRMENTS: decreased activity tolerance, decreased balance, decreased endurance, decreased strength, impaired  perceived functional ability, and pain.   ACTIVITY LIMITATIONS: lifting, bending, standing, squatting, and locomotion level  PARTICIPATION LIMITATIONS: meal prep, cleaning, laundry, interpersonal relationship, shopping, community activity, and yard work  PERSONAL FACTORS: Past/current experiences and Time since onset of injury/illness/exacerbation are also affecting patient's functional outcome.   REHAB POTENTIAL: Good  CLINICAL DECISION MAKING: Stable/uncomplicated  EVALUATION COMPLEXITY: Low   GOALS: Goals reviewed with patient? Yes  SHORT TERM GOALS: Target date: 04/13/24 Pt will be instructed on initial HEP for LE strengthening/balance training to work on >3x/week Baseline:at visit #2 Goal status: INITIAL   LONG TERM GOALS: Target date: 05/30/24  Pt will be able to perform 5x STS in <12 seconds, indicating reduced risk for falls Baseline: 16 seconds Goal status: INITIAL  2.  Pt will be able to complete 6 min walk test to further assess standing and walking tolerance to establish baseline walking tolerance at this stage Baseline: will administer at visit #2 Goal status: INITIAL  3.  Pt will be able to stand SLS x >20 seconds R and L on even and uneven surfaces to improve safety and reduce fall risk during amb Baseline: 5-10 sec Goal status: INITIAL   PLAN:  PT FREQUENCY: 1-2x/week  PT DURATION: 8 weeks  PLANNED INTERVENTIONS: 97110-Therapeutic exercises, 97530- Therapeutic activity, [id number]- Neuromuscular re-education, 97535- Self Care, and 02859- Manual therapy  PLAN FOR NEXT SESSION: LE strength, balance training, 6 min walk at next visit, recheck 5x STS  Vernell Reges, PT, DPT, OCS  Vernell FORBES Reges, PT 05/07/2024, 11:28 AM

## 2024-05-09 ENCOUNTER — Encounter

## 2024-05-14 ENCOUNTER — Ambulatory Visit

## 2024-05-14 DIAGNOSIS — M6281 Muscle weakness (generalized): Secondary | ICD-10-CM | POA: Diagnosis not present

## 2024-05-14 DIAGNOSIS — R2689 Other abnormalities of gait and mobility: Secondary | ICD-10-CM

## 2024-05-14 DIAGNOSIS — R5381 Other malaise: Secondary | ICD-10-CM

## 2024-05-14 NOTE — Therapy (Signed)
 OUTPATIENT PHYSICAL THERAPY LOWER EXTREMITY TREATMENT   Patient Name: Brittney Horton MRN: [id number] DOB:Oct 13, 1965, 58 y.o., female Today's Date: 05/14/2024  END OF SESSION:  PT End of Session - 05/14/24 1031     Visit Number 6    Number of Visits 17    Date for Recertification  05/30/24    Authorization Type 8 weeks (2x/week) requested    PT Start Time 1030    PT Stop Time 1114    PT Time Calculation (min) 44 min    Activity Tolerance Patient tolerated treatment well    Behavior During Therapy [facility] for tasks assessed/performed           Past Medical History:  Diagnosis Date   Anxiety    Breast cancer (HCC) 08/01/14 bx   left breast LOQ   Headache    Hypertension    Personal history of radiation therapy    S/P radiation therapy 10/10/14-11/28/14   left breast 60.4Gy   Past Surgical History:  Procedure Laterality Date   BREAST BIOPSY Left 07/2014   BREAST BIOPSY Left 06/2014   BREAST LUMPECTOMY Left 09/06/2014   BREAST LUMPECTOMY WITH AXILLARY LYMPH NODE BIOPSY Left 09/06/14   ductal ca in situ, high grade   CESAREAN SECTION     x5   DIAGNOSTIC LAPAROSCOPY     tubal preg   TUBAL LIGATION     Patient Active Problem List   Diagnosis Date Noted   Dysuria 10/28/2023   Urinary frequency 10/28/2023   Acute UTI 10/28/2023   Malignant neoplasm of upper-outer quadrant of left female breast (HCC) 10/30/2014   Breast cancer in situ 09/06/2014   Family history of malignant neoplasm of breast 09/06/2014   Breast cancer of upper-outer quadrant of left female breast (HCC) 08/07/2014    PCP: [facility]  REFERRING PROVIDER: Dr. 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    THERAPY DIAG:  Muscle weakness (generalized)  Physical deconditioning  Balance problem  Rationale for Evaluation and Treatment:  Rehabilitation  ONSET DATE: for at least a year   SUBJECTIVE:   SUBJECTIVE STATEMENT: Pt is having difficulty with her legs at night- having pain, stiffness, tingling sensation, tightness- she attributes it to a side effect for her chemotherapy- started this tx for ~ 1+ year  1 fall- 1 month ago when lost balance  Feeling some improvement since starting a new medication a little over 2 weeks ago for seizure-like activity (had an EEG- which shoed some increased activity on R side of her brain), having increased anxiety though from this medication  Typically worse with later in the day/at night and then if she is doing a lot of activity; sometimes it is affecting her sleep    Decreased standing/walking tolerance   She goes for chemo every 6 months; she feels the worst 3 days after her tx   PERTINENT HISTORY: Currently a patient at [facility] for 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 Yes 6-7/10  PRECAUTIONS: Fall  RED FLAGS: Active stage 4 breast cancer   WEIGHT BEARING RESTRICTIONS: No  FALLS:  Has patient fallen in last 6 months? Yes. Number of falls >1  LIVING ENVIRONMENT: Lives with: lives with their family Lives in: House/apartment Stairs: No Has following equipment at home: None  OCCUPATION: not working; was managing a Occupational hygienist truck [employer]; was working at  [employer] then stopped when she began her tx for cancer since March 2024  PLOF: Independent  PATIENT GOALS: pt would like to work on getting her legs moving better and also feeling less discomfort as she is trying to wind down at night  NEXT MD VISIT: every 2 months she has f/u   OBJECTIVE:  Note: Objective measures were completed at Evaluation unless otherwise noted.  DIAGNOSTIC FINDINGS: pt attends regularly scheduled appointments at [facility] for infusions, scans, and f/u with MD for cancer tx (see chart review)  PATIENT SURVEYS:  ABC scale to be given at visit #2  COGNITION: Overall  cognitive status: Within functional limits for tasks assessed     SENSATION: WFL  POSTURE: rounded shoulders  PALPATION: PROM hip/knees b/l WNL  LOWER EXTREMITY ROM:  Active ROM Right eval Left eval  Hip flexion    Hip extension    Hip abduction    Hip adduction    Hip internal rotation    Hip external rotation    Knee flexion    Knee extension    Ankle dorsiflexion    Ankle plantarflexion    Ankle inversion    Ankle eversion     (Blank rows = not tested)  LOWER EXTREMITY MMT:  MMT Right eval Left eval  Hip flexion 4+ 4  Hip extension    Hip abduction    Hip adduction    Hip internal rotation    Hip external rotation    Knee flexion 4 4-  Knee extension 4 4-  Ankle dorsiflexion    Ankle plantarflexion 4 4-  Ankle inversion    Ankle eversion     (Blank rows = not tested)   FUNCTIONAL TESTS:  SL stance: R x 10 sec, L x 5 seconds Tandem stance: increased trunk sway observed, able to stand x 10 seconds  5x STS 16 seconds  GAIT: Pt amb with decreased gait speed/decreased hip extension motion and posterior chain mm activation b/l                                                                                                                              TREATMENT DATE: 05/14/24 Subjective: PT reports she continues to work on her HEP.  She has an appointment tomorrow for routine cancer scans and to further assess her concerns with the soft tissue growth in her L hip area.  She is pleased with her progress in PT and states she will try to continue with PT as a HEP but realistically may not continue due to other aspects of her life feeling more important right now.    Objective: Pain: currently 0-1/10  5X STS: 11 seconds today SLS: 17-20 sec R/L LE LE MMT: same as initial evaluation MMT Right eval Left eval  Hip flexion 4+ 4  Hip extension    Hip abduction    Hip adduction    Hip internal rotation    Hip external  rotation    Knee flexion 4 4-  Knee  extension 4 4-  Ankle dorsiflexion    Ankle plantarflexion 4 4-  Ankle inversion    Ankle eversion     (Blank rows = not tested)  Therapeutic Exercise: Supine heel slide: x 10 ea not today Hooklying LTR x 10 ea not today SKTC x 5 ea not today Supine ankle DF/PF x 10 ea- not today Bridges x 10- not today SLR x 10 ea not today Supine hip abd/add x 20 ea- sitting today, ball and blue TB Standing heel raises x 10, 2 sets Standing toe raises x10, 2 sets Seated knee extension: 5# x10, x8 ea LE Seated marches: 5# x10, x8 ea LE   Therapeutic Activities: Sit to stand x 5, x5 x5 Standing hip abd x 10 ea LE, 2 sets, PT cues for upright trunk posture Standing mini squat x10 Discussed benefits of performing HEP before sleep for sx management, benefits of exercise for sx management  Neuro re-ed:  In parallel bars Airex: feet together 3 rounds, head turns R/L x10 ea, up/down x10 ea; SLS R/L x3 rounds, repeated 3 rounds again (L more challenging than R)- not today Tandem stance: R and L 3x ea  SLS R and L 5x ea Tandem walk on line x 4 laps   HEP instruction: Access Code: [email or web address] URL: [email or web address] Date: 04/16/2024 Prepared by: Vernell Reges  Exercises - Supine Heel Slide  - 1 x daily - 7 x weekly - 2 sets - 5 reps - Supine Lower Trunk Rotation  - 1 x daily - 7 x weekly - 2 sets - 5 reps - Hooklying Single Knee to Chest Stretch  - 1 x daily - 7 x weekly - 1 sets - 5 reps - Supine Bridge  - 1 x daily - 7 x weekly - 2 sets - 5 reps - Supine Ankle Pumps  - 1 x daily - 7 x weekly - 2 sets - 10 reps  PATIENT EDUCATION:  Education details: PT POC/goals, benefits of PT, exercise technique/pacing/benefits Person educated: Patient Education method: Explanation Education comprehension: verbalized understanding and needs further education  HOME EXERCISE PROGRAM: See above  ASSESSMENT:  CLINICAL IMPRESSION: Patient is a 58 y.o. F who was seen today for physical  therapy treatment for decreased standing/walking tolerance, LE weakness, LE pain, deconditioning, decreased balance associated with her current cancer diagnosis and treatment plan.  Pt attended 6 out of 6 approved PT visits.  Overall her balance has improved and 5x STS has improved MMT grades remain similar to initial evaluation.  Today she states that she will try to continue with her HEP independently but also realistically states that other medical concerns and enjoying overall quality of life will likely take priority over PT.  She has made progress towards and met some of PT goals for this course of tx.  Plan to DC today.    OBJECTIVE IMPAIRMENTS: decreased activity tolerance, decreased balance, decreased endurance, decreased strength, impaired perceived functional ability, and pain.   ACTIVITY LIMITATIONS: lifting, bending, standing, squatting, and locomotion level  PARTICIPATION LIMITATIONS: meal prep, cleaning, laundry, interpersonal relationship, shopping, community activity, and yard work  PERSONAL FACTORS: Past/current experiences and Time since onset of injury/illness/exacerbation are also affecting patient's functional outcome.   REHAB POTENTIAL: Good  CLINICAL DECISION MAKING: Stable/uncomplicated  EVALUATION COMPLEXITY: Low   GOALS: Goals reviewed with patient? Yes  SHORT TERM GOALS: Target date: 04/13/24 Pt will be instructed on initial HEP for LE strengthening/balance  training to work on >3x/week Baseline:at visit #2 Goal status:Met   LONG TERM GOALS: Target date: 05/30/24  Pt will be able to perform 5x STS in <12 seconds, indicating reduced risk for falls Baseline: 16 seconds Goal status: Met  2.  Pt will be able to complete 6 min walk test to further assess standing and walking tolerance to establish baseline walking tolerance at this stage Baseline: will administer at visit #2 Goal status: DC  3.  Pt will be able to stand SLS x >20 seconds R and L on even and  uneven surfaces to improve safety and reduce fall risk during amb Baseline: 5-10 sec Goal status: Met   PLAN:  PT FREQUENCY: 1-2x/week  PT DURATION: 8 weeks  PLANNED INTERVENTIONS: 97110-Therapeutic exercises, 97530- Therapeutic activity, [id number]- Neuromuscular re-education, 97535- Self Care, and [id number]- Manual therapy  PLAN FOR NEXT SESSION: Pt will continue with HEP independently; DC this course of PT  Vernell Reges, PT, DPT, OCS   Vernell FORBES Reges, PT 05/14/2024, 10:31 AM

## 2024-05-16 ENCOUNTER — Encounter
